# Patient Record
Sex: Female | Born: 1980 | Race: White | Hispanic: No | Marital: Married | Smoking: Never smoker
Health system: Southern US, Community
[De-identification: ages and names within clinical notes are randomized; demographics above are authoritative.]

## PROBLEM LIST (undated history)

## (undated) DIAGNOSIS — F329 Major depressive disorder, single episode, unspecified: Secondary | ICD-10-CM

## (undated) DIAGNOSIS — F32A Depression, unspecified: Secondary | ICD-10-CM

## (undated) DIAGNOSIS — F419 Anxiety disorder, unspecified: Secondary | ICD-10-CM

## (undated) DIAGNOSIS — F909 Attention-deficit hyperactivity disorder, unspecified type: Secondary | ICD-10-CM

## (undated) DIAGNOSIS — J45909 Unspecified asthma, uncomplicated: Secondary | ICD-10-CM

## (undated) HISTORY — PX: DILATION AND CURETTAGE OF UTERUS: SHX78

---
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
## 2005-09-30 ENCOUNTER — Encounter: Admission: RE | Admit: 2005-09-30 | Discharge: 2005-09-30 | Payer: Self-pay | Admitting: Family Medicine

## 2006-02-13 ENCOUNTER — Other Ambulatory Visit: Admission: RE | Admit: 2006-02-13 | Discharge: 2006-02-13 | Payer: Self-pay | Admitting: Obstetrics and Gynecology

## 2007-03-16 ENCOUNTER — Other Ambulatory Visit: Admission: RE | Admit: 2007-03-16 | Discharge: 2007-03-16 | Payer: Self-pay | Admitting: Obstetrics and Gynecology

## 2007-09-05 ENCOUNTER — Ambulatory Visit (HOSPITAL_COMMUNITY): Admission: RE | Admit: 2007-09-05 | Discharge: 2007-09-05 | Payer: Self-pay | Admitting: Family Medicine

## 2007-12-01 ENCOUNTER — Ambulatory Visit (HOSPITAL_COMMUNITY): Admission: RE | Admit: 2007-12-01 | Discharge: 2007-12-01 | Payer: Self-pay | Admitting: Obstetrics and Gynecology

## 2007-12-09 ENCOUNTER — Encounter: Admission: RE | Admit: 2007-12-09 | Discharge: 2007-12-09 | Payer: Self-pay | Admitting: Obstetrics and Gynecology

## 2008-08-07 ENCOUNTER — Other Ambulatory Visit: Admission: RE | Admit: 2008-08-07 | Discharge: 2008-08-07 | Payer: Self-pay | Admitting: Obstetrics and Gynecology

## 2008-08-21 ENCOUNTER — Ambulatory Visit (HOSPITAL_COMMUNITY): Admission: RE | Admit: 2008-08-21 | Discharge: 2008-08-21 | Payer: Self-pay | Admitting: Obstetrics and Gynecology

## 2008-08-21 ENCOUNTER — Encounter (INDEPENDENT_AMBULATORY_CARE_PROVIDER_SITE_OTHER): Payer: Self-pay | Admitting: Obstetrics and Gynecology

## 2008-08-21 ENCOUNTER — Other Ambulatory Visit: Payer: Self-pay | Admitting: Obstetrics and Gynecology

## 2009-10-06 ENCOUNTER — Ambulatory Visit (HOSPITAL_COMMUNITY): Admission: RE | Admit: 2009-10-06 | Discharge: 2009-10-06 | Payer: Self-pay | Admitting: Internal Medicine

## 2010-07-20 ENCOUNTER — Encounter (INDEPENDENT_AMBULATORY_CARE_PROVIDER_SITE_OTHER): Payer: Self-pay | Admitting: Obstetrics and Gynecology

## 2010-07-20 ENCOUNTER — Inpatient Hospital Stay (HOSPITAL_COMMUNITY): Admission: AD | Admit: 2010-07-20 | Discharge: 2010-07-22 | Payer: Self-pay | Admitting: Obstetrics and Gynecology

## 2010-12-08 ENCOUNTER — Encounter: Payer: Self-pay | Admitting: Obstetrics and Gynecology

## 2011-01-30 LAB — CBC
HCT: 34.9 % — ABNORMAL LOW (ref 36.0–46.0)
HCT: 35.3 % — ABNORMAL LOW (ref 36.0–46.0)
Hemoglobin: 11.8 g/dL — ABNORMAL LOW (ref 12.0–15.0)
Hemoglobin: 11.8 g/dL — ABNORMAL LOW (ref 12.0–15.0)
MCH: 31 pg (ref 26.0–34.0)
MCH: 31.1 pg (ref 26.0–34.0)
MCHC: 33.5 g/dL (ref 30.0–36.0)
MCHC: 33.8 g/dL (ref 30.0–36.0)
MCV: 91.6 fL (ref 78.0–100.0)
MCV: 92.8 fL (ref 78.0–100.0)
Platelets: 204 10*3/uL (ref 150–400)
Platelets: 214 10*3/uL (ref 150–400)
RBC: 3.81 MIL/uL — ABNORMAL LOW (ref 3.87–5.11)
RBC: 3.81 MIL/uL — ABNORMAL LOW (ref 3.87–5.11)
RDW: 14.7 % (ref 11.5–15.5)
RDW: 14.8 % (ref 11.5–15.5)
WBC: 11.3 10*3/uL — ABNORMAL HIGH (ref 4.0–10.5)
WBC: 20 10*3/uL — ABNORMAL HIGH (ref 4.0–10.5)

## 2011-01-30 LAB — RPR: RPR Ser Ql: NONREACTIVE

## 2011-02-19 LAB — CBC
HCT: 32.5 % — ABNORMAL LOW (ref 36.0–46.0)
Hemoglobin: 11.1 g/dL — ABNORMAL LOW (ref 12.0–15.0)
MCHC: 34.2 g/dL (ref 30.0–36.0)
MCV: 92.5 fL (ref 78.0–100.0)
Platelets: 212 10*3/uL (ref 150–400)
RBC: 3.52 MIL/uL — ABNORMAL LOW (ref 3.87–5.11)
RDW: 12.4 % (ref 11.5–15.5)
WBC: 6.5 10*3/uL (ref 4.0–10.5)

## 2011-02-19 LAB — LUPUS ANTICOAGULANT PANEL
DRVVT: 33 secs — ABNORMAL LOW (ref 34.7–40.5)
Lupus Anticoagulant: NOT DETECTED
PTT Lupus Anticoagulant: 35.5 secs (ref 32.0–43.4)

## 2011-02-19 LAB — ANA: Anti Nuclear Antibody(ANA): NEGATIVE

## 2011-04-01 NOTE — Op Note (Signed)
NAME:  Megan Foley, Megan Foley                ACCOUNT NO.:  [id number]   MEDICAL RECORD NO.:  [id number]          PATIENT TYPE:  AMB   LOCATION:  SDC                           FACILITY:  [facility]   PHYSICIAN:  Charles A. 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   PROCEDURE:  1. Dilation and evacuation.  2. Paracervical block.   SURGEON:  Charles A. Delcambre, MD   ASSISTANT:  None.   COMPLICATIONS:  None.   ESTIMATED BLOOD LOSS:  50 mL.   ANESTHESIA:  IV sedation, monitored anesthesia care.   FINDINGS:  Large amount of products of conception to pathology as well  as to chromosome analysis.   INSTRUMENT, SPONGE, AND NEEDLE COUNT:  Correct x2.   DESCRIPTION OF PROCEDURE:  The patient was taken to the operating room  and placed in supine position.  Anesthesia was dosed without difficulty.  She was then placed in dorsal lithotomy position.  Sterile prep and  drape was undertaken.  Tenaculum was used with a speculum in the vagina  and anterior lip of the cervix, and sound was gently passed to note 12  cm.  Hanks dilators were used to dilate up to a maximal Hanks dilator,  then three dilators were used up to Hegar #37 and this did allow passing  of a 10-mm curved suction curette, but I could not get a 12-mm curette  to go through the cervix safely.  For this reason, a 10-mm curette was  used, 50 mmHg of suction, and three successive passes yielded a large  amount of fluid and tissue.  Fourth pass yielded minimal  tissue.  Generalized curettage was used to achieve a gritty curetted surface.  Exploration with small polyp forceps and ring forceps yielded no  significant amounts of tissue.  A final pass with the suction curette  was undertaken and yielded no further tissue.  A large amount of tissue  in the first two passes could be seen coming out through the suction  tubing consistent with proper evacuation of the pregnancy.  Some tissue  was sent to chromosome analysis, and tenaculum was removed.  Hemostasis  was excellent.  Toradol 30 mg IV was given prophylactically, five 200  mcg tablets of  Cytotec were placed in the vagina, and she will maintain Methergine 0.2  every 6 hours as she has at home as well as Percocet 1-2 p.o. q.4-6 h.  p.r.n. #40.  She feels she has about 3 of these left at home.  The  patient was taken to recovery with physician attendance having tolerated  the procedure well.      Charles A. Delcambre,  MD  Electronically Signed     CAD/MEDQ  D:  08/21/2008  T:  08/22/2008  Job:  [id number]

## 2011-08-19 LAB — CBC
HCT: 34.9 — ABNORMAL LOW
Hemoglobin: 12
MCHC: 34.5
MCV: 91.9
Platelets: 256
RBC: 3.8 — ABNORMAL LOW
RDW: 11.8
WBC: 8.2

## 2011-08-19 LAB — ABO/RH: ABO/RH(D): A POS

## 2019-12-26 ENCOUNTER — Inpatient Hospital Stay (HOSPITAL_COMMUNITY): Payer: No Typology Code available for payment source

## 2019-12-26 ENCOUNTER — Inpatient Hospital Stay (HOSPITAL_COMMUNITY)
Admission: EM | Admit: 2019-12-26 | Discharge: 2019-12-30 | DRG: 517 | Disposition: A | Discharge status: Home Health | Payer: No Typology Code available for payment source | Attending: Student | Admitting: Student

## 2019-12-26 ENCOUNTER — Encounter (HOSPITAL_COMMUNITY): Payer: Self-pay | Admitting: Student

## 2019-12-26 ENCOUNTER — Encounter (HOSPITAL_COMMUNITY)
Admission: EM | Disposition: A | Discharge status: Home Health | Payer: Self-pay | Source: Home / Self Care | Attending: Student

## 2019-12-26 ENCOUNTER — Emergency Department (HOSPITAL_COMMUNITY): Payer: No Typology Code available for payment source

## 2019-12-26 ENCOUNTER — Inpatient Hospital Stay (HOSPITAL_COMMUNITY): Payer: No Typology Code available for payment source | Admitting: Certified Registered"

## 2019-12-26 DIAGNOSIS — S32422A Displaced fracture of posterior wall of left acetabulum, initial encounter for closed fracture: Secondary | ICD-10-CM | POA: Diagnosis not present

## 2019-12-26 DIAGNOSIS — Z20822 Contact with and (suspected) exposure to covid-19: Secondary | ICD-10-CM | POA: Diagnosis not present

## 2019-12-26 DIAGNOSIS — W010XXA Fall on same level from slipping, tripping and stumbling without subsequent striking against object, initial encounter: Secondary | ICD-10-CM | POA: Diagnosis present

## 2019-12-26 DIAGNOSIS — Z79899 Other long term (current) drug therapy: Secondary | ICD-10-CM

## 2019-12-26 DIAGNOSIS — W19XXXA Unspecified fall, initial encounter: Secondary | ICD-10-CM

## 2019-12-26 DIAGNOSIS — Y9289 Other specified places as the place of occurrence of the external cause: Secondary | ICD-10-CM

## 2019-12-26 DIAGNOSIS — S32402A Unspecified fracture of left acetabulum, initial encounter for closed fracture: Secondary | ICD-10-CM

## 2019-12-26 DIAGNOSIS — S73005A Unspecified dislocation of left hip, initial encounter: Secondary | ICD-10-CM

## 2019-12-26 DIAGNOSIS — Y990 Civilian activity done for income or pay: Secondary | ICD-10-CM | POA: Diagnosis not present

## 2019-12-26 DIAGNOSIS — J45909 Unspecified asthma, uncomplicated: Secondary | ICD-10-CM | POA: Diagnosis present

## 2019-12-26 DIAGNOSIS — T148XXA Other injury of unspecified body region, initial encounter: Secondary | ICD-10-CM

## 2019-12-26 DIAGNOSIS — F909 Attention-deficit hyperactivity disorder, unspecified type: Secondary | ICD-10-CM | POA: Diagnosis present

## 2019-12-26 DIAGNOSIS — Z888 Allergy status to other drugs, medicaments and biological substances status: Secondary | ICD-10-CM

## 2019-12-26 DIAGNOSIS — F329 Major depressive disorder, single episode, unspecified: Secondary | ICD-10-CM | POA: Diagnosis not present

## 2019-12-26 DIAGNOSIS — S73015A Posterior dislocation of left hip, initial encounter: Secondary | ICD-10-CM | POA: Insufficient documentation

## 2019-12-26 DIAGNOSIS — F419 Anxiety disorder, unspecified: Secondary | ICD-10-CM | POA: Diagnosis present

## 2019-12-26 DIAGNOSIS — Z419 Encounter for procedure for purposes other than remedying health state, unspecified: Secondary | ICD-10-CM

## 2019-12-26 DIAGNOSIS — M25552 Pain in left hip: Secondary | ICD-10-CM | POA: Diagnosis present

## 2019-12-26 HISTORY — PX: HIP CLOSED REDUCTION: SHX983

## 2019-12-26 HISTORY — DX: Unspecified asthma, uncomplicated: J45.909

## 2019-12-26 HISTORY — PX: INSERTION OF TRACTION PIN: SHX6560

## 2019-12-26 HISTORY — DX: Attention-deficit hyperactivity disorder, unspecified type: F90.9

## 2019-12-26 HISTORY — DX: Depression, unspecified: F32.A

## 2019-12-26 HISTORY — DX: Anxiety disorder, unspecified: F41.9

## 2019-12-26 LAB — CBC WITH DIFFERENTIAL/PLATELET
Abs Immature Granulocytes: 0.07 10*3/uL (ref 0.00–0.07)
Basophils Absolute: 0 10*3/uL (ref 0.0–0.1)
Basophils Relative: 0 %
Eosinophils Absolute: 0.1 10*3/uL (ref 0.0–0.5)
Eosinophils Relative: 1 %
HCT: 33.7 % — ABNORMAL LOW (ref 36.0–46.0)
Hemoglobin: 11.1 g/dL — ABNORMAL LOW (ref 12.0–15.0)
Immature Granulocytes: 1 %
Lymphocytes Relative: 14 %
Lymphs Abs: 1.8 10*3/uL (ref 0.7–4.0)
MCH: 30.7 pg (ref 26.0–34.0)
MCHC: 32.9 g/dL (ref 30.0–36.0)
MCV: 93.4 fL (ref 80.0–100.0)
Monocytes Absolute: 0.5 10*3/uL (ref 0.1–1.0)
Monocytes Relative: 4 %
Neutro Abs: 10.5 10*3/uL — ABNORMAL HIGH (ref 1.7–7.7)
Neutrophils Relative %: 80 %
Platelets: 255 10*3/uL (ref 150–400)
RBC: 3.61 MIL/uL — ABNORMAL LOW (ref 3.87–5.11)
RDW: 12 % (ref 11.5–15.5)
WBC: 12.8 10*3/uL — ABNORMAL HIGH (ref 4.0–10.5)
nRBC: 0 % (ref 0.0–0.2)

## 2019-12-26 LAB — I-STAT BETA HCG BLOOD, ED (MC, WL, AP ONLY): I-stat hCG, quantitative: <5 m[IU]/mL (ref <5)

## 2019-12-26 LAB — BASIC METABOLIC PANEL
Anion gap: 12 (ref 5–15)
BUN: 11 mg/dL (ref 6–20)
CO2: 23 mmol/L (ref 22–32)
Calcium: 9 mg/dL (ref 8.9–10.3)
Chloride: 103 mmol/L (ref 98–111)
Creatinine, Ser: 0.63 mg/dL (ref 0.44–1.00)
GFR calc Af Amer: >60 mL/min (ref >60)
GFR calc non Af Amer: >60 mL/min (ref >60)
Glucose, Bld: 106 mg/dL — ABNORMAL HIGH (ref 70–99)
Potassium: 3.4 mmol/L — ABNORMAL LOW (ref 3.5–5.1)
Sodium: 138 mmol/L (ref 135–145)

## 2019-12-26 LAB — RESPIRATORY PANEL BY RT PCR (FLU A&B, COVID)
Influenza A by PCR: NEGATIVE
Influenza B by PCR: NEGATIVE
SARS Coronavirus 2 by RT PCR: NEGATIVE

## 2019-12-26 LAB — HIV ANTIBODY (ROUTINE TESTING W REFLEX): HIV Screen 4th Generation wRfx: NONREACTIVE

## 2019-12-26 SURGERY — CLOSED REDUCTION, HIP
Anesthesia: General | Site: Leg Upper | Laterality: Left

## 2019-12-26 MED ORDER — MEPERIDINE HCL 25 MG/ML IJ SOLN: 6.2500 mg | INTRAMUSCULAR | Status: DC | PRN

## 2019-12-26 MED ORDER — MORPHINE SULFATE (PF) 2 MG/ML IV SOLN
2.0000 mg | INTRAVENOUS | Status: DC | PRN
  Administered 2019-12-28: 21:00:00 2 mg via INTRAVENOUS
  Filled 2019-12-26: qty 1

## 2019-12-26 MED ORDER — METHOCARBAMOL 500 MG PO TABS
500.0000 mg | ORAL_TABLET | Freq: Four times a day (QID) | ORAL | Status: DC | PRN
  Administered 2019-12-28 – 2019-12-30 (×4): 500 mg via ORAL
  Filled 2019-12-26 (×4): qty 1

## 2019-12-26 MED ORDER — CLONAZEPAM 0.5 MG PO TABS: 0.5000 mg | ORAL_TABLET | Freq: Two times a day (BID) | ORAL | Status: DC | PRN

## 2019-12-26 MED ORDER — LACTATED RINGERS IV SOLN: INTRAVENOUS | Status: DC | PRN

## 2019-12-26 MED ORDER — ALBUTEROL SULFATE (2.5 MG/3ML) 0.083% IN NEBU: 2.5000 mg | INHALATION_SOLUTION | Freq: Four times a day (QID) | RESPIRATORY_TRACT | Status: DC | PRN

## 2019-12-26 MED ORDER — ROCURONIUM BROMIDE 10 MG/ML (PF) SYRINGE
PREFILLED_SYRINGE | INTRAVENOUS | Status: AC
  Filled 2019-12-26: qty 20

## 2019-12-26 MED ORDER — SUGAMMADEX SODIUM 200 MG/2ML IV SOLN
INTRAVENOUS | Status: DC | PRN
  Administered 2019-12-26: 200 mg via INTRAVENOUS

## 2019-12-26 MED ORDER — TRAZODONE HCL 50 MG PO TABS
25.0000 mg | ORAL_TABLET | Freq: Every evening | ORAL | Status: DC | PRN
  Administered 2019-12-28: 21:00:00 25 mg via ORAL
  Filled 2019-12-26 (×2): qty 1

## 2019-12-26 MED ORDER — ENOXAPARIN SODIUM 40 MG/0.4ML ~~LOC~~ SOLN
40.0000 mg | SUBCUTANEOUS | Status: DC
  Administered 2019-12-27: 18:00:00 40 mg via SUBCUTANEOUS
  Filled 2019-12-26: qty 0.4

## 2019-12-26 MED ORDER — OXYCODONE HCL 5 MG PO TABS
5.0000 mg | ORAL_TABLET | ORAL | Status: DC | PRN
  Administered 2019-12-26 – 2019-12-27 (×2): 10 mg via ORAL
  Administered 2019-12-27: 22:00:00 15 mg via ORAL
  Administered 2019-12-27 (×2): 10 mg via ORAL
  Administered 2019-12-28 – 2019-12-29 (×7): 15 mg via ORAL
  Administered 2019-12-30 (×2): 10 mg via ORAL
  Administered 2019-12-30: 06:00:00 15 mg via ORAL
  Filled 2019-12-26 (×3): qty 3
  Filled 2019-12-26: qty 2
  Filled 2019-12-26 (×3): qty 3
  Filled 2019-12-26 (×2): qty 2
  Filled 2019-12-26 (×2): qty 3
  Filled 2019-12-26 (×3): qty 2
  Filled 2019-12-26: qty 3

## 2019-12-26 MED ORDER — ACETAMINOPHEN 10 MG/ML IV SOLN
INTRAVENOUS | Status: AC
  Filled 2019-12-26: qty 100

## 2019-12-26 MED ORDER — MIDAZOLAM HCL 2 MG/2ML IJ SOLN
INTRAMUSCULAR | Status: DC | PRN
  Administered 2019-12-26: 2 mg via INTRAVENOUS

## 2019-12-26 MED ORDER — ONDANSETRON HCL 4 MG/2ML IJ SOLN: 4.0000 mg | Freq: Four times a day (QID) | INTRAMUSCULAR | Status: DC | PRN

## 2019-12-26 MED ORDER — ACETAMINOPHEN 325 MG PO TABS: 325.0000 mg | ORAL_TABLET | Freq: Once | ORAL | Status: DC | PRN

## 2019-12-26 MED ORDER — PROPOFOL 10 MG/ML IV BOLUS
INTRAVENOUS | Status: AC
  Filled 2019-12-26: qty 20

## 2019-12-26 MED ORDER — SUCCINYLCHOLINE CHLORIDE 20 MG/ML IJ SOLN
INTRAMUSCULAR | Status: DC | PRN
  Administered 2019-12-26: 120 mg via INTRAVENOUS

## 2019-12-26 MED ORDER — LORATADINE 10 MG PO TABS
10.0000 mg | ORAL_TABLET | Freq: Every day | ORAL | Status: DC
  Administered 2019-12-26 – 2019-12-30 (×4): 10 mg via ORAL
  Filled 2019-12-26 (×4): qty 1

## 2019-12-26 MED ORDER — KETOROLAC TROMETHAMINE 30 MG/ML IJ SOLN
INTRAMUSCULAR | Status: AC
  Filled 2019-12-26: qty 1

## 2019-12-26 MED ORDER — DEXAMETHASONE SODIUM PHOSPHATE 10 MG/ML IJ SOLN
INTRAMUSCULAR | Status: DC | PRN
  Administered 2019-12-26: 5 mg via INTRAVENOUS

## 2019-12-26 MED ORDER — PROPOFOL 10 MG/ML IV BOLUS
INTRAVENOUS | Status: DC | PRN
  Administered 2019-12-26: 120 mg via INTRAVENOUS

## 2019-12-26 MED ORDER — FENTANYL CITRATE (PF) 100 MCG/2ML IJ SOLN
INTRAMUSCULAR | Status: DC | PRN
  Administered 2019-12-26: 50 ug via INTRAVENOUS
  Administered 2019-12-26: 100 ug via INTRAVENOUS

## 2019-12-26 MED ORDER — LACTATED RINGERS IV SOLN: INTRAVENOUS | Status: DC

## 2019-12-26 MED ORDER — ONDANSETRON HCL 4 MG/2ML IJ SOLN
INTRAMUSCULAR | Status: DC | PRN
  Administered 2019-12-26: 4 mg via INTRAVENOUS

## 2019-12-26 MED ORDER — HYDROMORPHONE HCL 1 MG/ML IJ SOLN
INTRAMUSCULAR | Status: AC
  Filled 2019-12-26: qty 1

## 2019-12-26 MED ORDER — METHOCARBAMOL 1000 MG/10ML IJ SOLN
500.0000 mg | Freq: Four times a day (QID) | INTRAVENOUS | Status: DC | PRN
  Filled 2019-12-26: qty 5

## 2019-12-26 MED ORDER — FENTANYL CITRATE (PF) 250 MCG/5ML IJ SOLN
INTRAMUSCULAR | Status: AC
  Filled 2019-12-26: qty 5

## 2019-12-26 MED ORDER — LIDOCAINE 2% (20 MG/ML) 5 ML SYRINGE
INTRAMUSCULAR | Status: AC
  Filled 2019-12-26: qty 5

## 2019-12-26 MED ORDER — LIDOCAINE 2% (20 MG/ML) 5 ML SYRINGE
INTRAMUSCULAR | Status: DC | PRN
  Administered 2019-12-26: 60 mg via INTRAVENOUS

## 2019-12-26 MED ORDER — MIDAZOLAM HCL 2 MG/2ML IJ SOLN
INTRAMUSCULAR | Status: AC
  Filled 2019-12-26: qty 2

## 2019-12-26 MED ORDER — ROCURONIUM BROMIDE 10 MG/ML (PF) SYRINGE
PREFILLED_SYRINGE | INTRAVENOUS | Status: DC | PRN
  Administered 2019-12-26: 40 mg via INTRAVENOUS

## 2019-12-26 MED ORDER — KETOROLAC TROMETHAMINE 30 MG/ML IJ SOLN
30.0000 mg | Freq: Four times a day (QID) | INTRAMUSCULAR | Status: DC | PRN
  Administered 2019-12-26: 18:00:00 30 mg via INTRAVENOUS

## 2019-12-26 MED ORDER — HYDROMORPHONE HCL 1 MG/ML IJ SOLN
0.2500 mg | INTRAMUSCULAR | Status: DC | PRN
  Administered 2019-12-26 (×2): 0.5 mg via INTRAVENOUS

## 2019-12-26 MED ORDER — ESCITALOPRAM OXALATE 10 MG PO TABS
10.0000 mg | ORAL_TABLET | Freq: Every day | ORAL | Status: DC
  Administered 2019-12-27 – 2019-12-30 (×3): 10 mg via ORAL
  Filled 2019-12-26 (×3): qty 1

## 2019-12-26 MED ORDER — PROMETHAZINE HCL 25 MG/ML IJ SOLN: 6.2500 mg | INTRAMUSCULAR | Status: DC | PRN

## 2019-12-26 MED ORDER — HYDROMORPHONE HCL 1 MG/ML IJ SOLN
1.0000 mg | Freq: Once | INTRAMUSCULAR | Status: AC
  Administered 2019-12-26: 14:00:00 1 mg via INTRAVENOUS
  Filled 2019-12-26: qty 1

## 2019-12-26 MED ORDER — ACETAMINOPHEN 10 MG/ML IV SOLN
1000.0000 mg | Freq: Once | INTRAVENOUS | Status: DC | PRN
  Administered 2019-12-26: 18:00:00 1000 mg via INTRAVENOUS

## 2019-12-26 MED ORDER — MORPHINE SULFATE (PF) 4 MG/ML IV SOLN
4.0000 mg | Freq: Once | INTRAVENOUS | Status: AC
  Administered 2019-12-26: 13:00:00 4 mg via INTRAVENOUS
  Filled 2019-12-26: qty 1

## 2019-12-26 MED ORDER — ONDANSETRON HCL 4 MG PO TABS: 4.0000 mg | ORAL_TABLET | Freq: Four times a day (QID) | ORAL | Status: DC | PRN

## 2019-12-26 MED ORDER — KCL IN DEXTROSE-NACL 20-5-0.45 MEQ/L-%-% IV SOLN
INTRAVENOUS | Status: DC
  Filled 2019-12-26 (×4): qty 1000

## 2019-12-26 MED ORDER — SUCCINYLCHOLINE CHLORIDE 200 MG/10ML IV SOSY
PREFILLED_SYRINGE | INTRAVENOUS | Status: AC
  Filled 2019-12-26: qty 10

## 2019-12-26 MED ORDER — ACETAMINOPHEN 160 MG/5ML PO SOLN: 325.0000 mg | Freq: Once | ORAL | Status: DC | PRN

## 2019-12-26 SURGICAL SUPPLY — 41 items
APL PRP STRL LF DISP 70% ISPRP (MISCELLANEOUS) ×2
BNDG ELASTIC 4X5.8 VLCR STR LF (GAUZE/BANDAGES/DRESSINGS) ×2 IMPLANT
BNDG ELASTIC 6X5.8 VLCR STR LF (GAUZE/BANDAGES/DRESSINGS) ×2 IMPLANT
BNDG GAUZE ELAST 4 BULKY (GAUZE/BANDAGES/DRESSINGS) ×6 IMPLANT
BRUSH SCRUB EZ PLAIN DRY (MISCELLANEOUS) ×6 IMPLANT
CHLORAPREP W/TINT 26 (MISCELLANEOUS) ×4 IMPLANT
COVER SURGICAL LIGHT HANDLE (MISCELLANEOUS) ×4 IMPLANT
COVER WAND RF STERILE (DRAPES) ×2 IMPLANT
DRAPE C-ARM 42X72 X-RAY (DRAPES) IMPLANT
DRAPE C-ARMOR (DRAPES) IMPLANT
DRAPE U-SHAPE 47X51 STRL (DRAPES) ×4 IMPLANT
DRSG ADAPTIC 3X8 NADH LF (GAUZE/BANDAGES/DRESSINGS) ×2 IMPLANT
ELECT REM PT RETURN 9FT ADLT (ELECTROSURGICAL)
ELECTRODE REM PT RTRN 9FT ADLT (ELECTROSURGICAL) ×2 IMPLANT
GAUZE SPONGE 4X4 12PLY STRL (GAUZE/BANDAGES/DRESSINGS) ×2 IMPLANT
GLOVE BIO SURGEON STRL SZ 6.5 (GLOVE) ×7 IMPLANT
GLOVE BIO SURGEON STRL SZ7.5 (GLOVE) ×10 IMPLANT
GLOVE BIO SURGEONS STRL SZ 6.5 (GLOVE) ×1
GLOVE BIOGEL PI IND STRL 6.5 (GLOVE) ×2 IMPLANT
GLOVE BIOGEL PI IND STRL 7.5 (GLOVE) ×2 IMPLANT
GLOVE BIOGEL PI INDICATOR 6.5 (GLOVE)
GLOVE BIOGEL PI INDICATOR 7.5 (GLOVE)
GOWN STRL REUS W/ TWL LRG LVL3 (GOWN DISPOSABLE) ×4 IMPLANT
GOWN STRL REUS W/TWL LRG LVL3 (GOWN DISPOSABLE) ×4
KIT BASIN OR (CUSTOM PROCEDURE TRAY) ×2 IMPLANT
KIT TURNOVER KIT B (KITS) ×4 IMPLANT
MANIFOLD NEPTUNE II (INSTRUMENTS) ×2 IMPLANT
NS IRRIG 1000ML POUR BTL (IV SOLUTION) ×2 IMPLANT
PACK SURGICAL SETUP 50X90 (CUSTOM PROCEDURE TRAY) ×2 IMPLANT
PACK TOTAL JOINT (CUSTOM PROCEDURE TRAY) ×2 IMPLANT
PAD ARMBOARD 7.5X6 YLW CONV (MISCELLANEOUS) ×4 IMPLANT
PADDING CAST COTTON 6X4 STRL (CAST SUPPLIES) ×6 IMPLANT
PIN STEINMAN 5/64X9 TRO PT NS (PIN) ×2 IMPLANT
SPONGE LAP 18X18 RF (DISPOSABLE) ×2 IMPLANT
STAPLER VISISTAT 35W (STAPLE) IMPLANT
SUT MNCRL AB 3-0 PS2 18 (SUTURE) ×2 IMPLANT
SUT MON AB 2-0 CT1 36 (SUTURE) ×2 IMPLANT
TOWEL GREEN STERILE (TOWEL DISPOSABLE) ×6 IMPLANT
TOWEL GREEN STERILE FF (TOWEL DISPOSABLE) ×6 IMPLANT
UNDERPAD 30X30 (UNDERPADS AND DIAPERS) ×2 IMPLANT
WATER STERILE IRR 1000ML POUR (IV SOLUTION) ×4 IMPLANT

## 2019-12-26 NOTE — Progress Notes (Signed)
Orthopedic Tech Progress Note Patient Details:  [name] [birth year] [id number] CT tech called for skeletal traction to be removed and reapplied for scans.  Musculoskeletal Traction Type of Traction: Skeletal (Balanced Suspension) Traction Location: LLE Traction Weight: 20 lbs   Post Interventions Patient Tolerated: Well  Patient ID: Megan Foley, female   DOB: 22-May-1981, 39 y.o.   MRN: [id number]   Ancil Linsey 12/26/2019, 10:44 PM

## 2019-12-26 NOTE — Op Note (Signed)
Orthopaedic Surgery Operative Note (CSN: [id number] ) Date of Surgery: 12/26/2019  Admit Date: 12/26/2019   Diagnoses: Pre-Op Diagnoses: Left posterior wall acetabular fracture-dislocation  Post-Op Diagnosis: Same  Procedures: 1. CPT 27252-Closed reduction of left hip dislocation 2. 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  She was brought to the emergency room where x-rays showed a left acetabular fracture dislocation.  I recommended proceeding urgently to the operating room for closed reduction and traction pin placement.  Risks and benefits were discussed with the patient.  She agreed to proceed with surgery and consent was obtained.  Operative Findings: 1.  Large posterior wall acetabular fracture dislocation with a successful closed reduction 2.  Distal femoral traction pin placement with 20 pounds of traction placed to the lower extremity.  Procedure: The patient was identified in the preoperative holding area. Consent was confirmed with the patient and their family and all questions were answered. The operative extremity was marked after confirmation with the patient. she was then brought back to the operating room by our anesthesia colleagues.  She was placed under general anesthetic and carefully transferred over to a radiolucent flat top table.  A timeout was performed to verify the patient, the procedure, and the extremity.  No antibiotics were given due to the close procedure.  Fluoroscopic imaging was obtained which showed a posterior  acetabular fracture dislocation with a large posterior wall fragment.  A gentle reduction maneuver was performed with an audible clunk.  Post reduction x-rays showed a concentric reduction of the femoral head.  The distal portion of her thigh was prepped and draped in sterile fashion.  Percutaneous placement of a 2.0 mm K wire was placed and advanced from medial to lateral through the lateral portion of the thigh.  A traction bow was placed to this traction pin and 20 pounds of traction was hung off the edge of the bed.  The patient was then awoken from anesthesia and taken the PACU in stable condition.  Post Op Plan/Instructions: Patient will be on bed rest until surgical fixation of her left hip.  We will obtain a postreduction CT scan for surgical planning.  We will likely plan for surgery tentatively on Wednesday morning.  She may be started on Lovenox for DVT prophylaxis starting tomorrow.  I was present and performed the entire surgery.  Ulyses Southward, PA-C did assist me throughout the case. An assistant was necessary given the difficulty in approach, maintenance of reduction and ability to instrument the fracture.   Truitt Merle, MD [facility]

## 2019-12-26 NOTE — Anesthesia Preprocedure Evaluation (Signed)
Anesthesia Evaluation  Patient identified by MRN, date of birth, ID band Patient awake    Reviewed: Allergy & Precautions, NPO status , Patient's Chart, lab work & pertinent test results  Airway Mallampati: I  TM Distance: >3 FB Neck ROM: Full    Dental no notable dental hx. (+) Dental Advisory Given   Pulmonary asthma ,    Pulmonary exam normal        Cardiovascular negative cardio ROS Normal cardiovascular exam     Neuro/Psych Anxiety    GI/Hepatic negative GI ROS, Neg liver ROS,   Endo/Other  negative endocrine ROS  Renal/GU negative Renal ROS     Musculoskeletal   Abdominal Normal abdominal exam  (+)   Peds  Hematology negative hematology ROS (+)   Anesthesia Other Findings   Reproductive/Obstetrics                             Anesthesia Physical Anesthesia Plan  ASA: II and emergent  Anesthesia Plan: General   Post-op Pain Management:    Induction: Intravenous, Cricoid pressure planned and Rapid sequence  PONV Risk Score and Plan: 4 or greater and Ondansetron, Dexamethasone, Midazolam and Scopolamine patch - Pre-op  Airway Management Planned: Oral ETT  Additional Equipment: None  Intra-op Plan:   Post-operative Plan: Extubation in OR  Informed Consent: I have reviewed the patients History and Physical, chart, labs and discussed the procedure including the risks, benefits and alternatives for the proposed anesthesia with the patient or authorized representative who has indicated his/her understanding and acceptance.     Dental advisory given  Plan Discussed with: CRNA  Anesthesia Plan Comments:         Anesthesia Quick Evaluation

## 2019-12-26 NOTE — H&P (Signed)
Please see consult note from same day for full H&P.

## 2019-12-26 NOTE — ED Notes (Signed)
Report given to short stay RN, consent on bed, pt states no cash valuable, cell phone on bed, will go to PACU with pt

## 2019-12-26 NOTE — ED Notes (Signed)
Consent signed.

## 2019-12-26 NOTE — H&P (View-Only) (Signed)
Reason for Consult:Acet fx Referring Physician: P Messick  Megan Foley is an 39 y.o. female.  HPI: Megan Foley was walking and tripped over a piece of playground equipment. She fell and landed on her left knee/side. She heard a pop and had immediate left hip pain. She managed to get up but had severe pain and fell down again. She works as a Runner, broadcasting/film/video.  No past medical history on file.  No family history on file.  Social History:  has no history on file for tobacco, alcohol, and drug.  Allergies:  Allergies  Allergen Reactions  . Mushroom Extract Complex Anaphylaxis, Anxiety, Swelling, Other (See Comments), Itching, Nausea And Vomiting, Shortness Of Breath and Hives    Medications: I have reviewed the patient's current medications.  Results for orders placed or performed during the hospital encounter of 12/26/19 (from the past 48 hour(s))  CBC with Differential     Status: Abnormal   Collection Time: 12/26/19  1:15 PM  Result Value Ref Range   WBC 12.8 (H) 4.0 - 10.5 K/uL   RBC 3.61 (L) 3.87 - 5.11 MIL/uL   Hemoglobin 11.1 (L) 12.0 - 15.0 g/dL   HCT 16.1 (L) 09.6 - 04.5 %   MCV 93.4 80.0 - 100.0 fL   MCH 30.7 26.0 - 34.0 pg   MCHC 32.9 30.0 - 36.0 g/dL   RDW 40.9 81.1 - 91.4 %   Platelets 255 150 - 400 K/uL   nRBC 0.0 0.0 - 0.2 %   Neutrophils Relative % 80 %   Neutro Abs 10.5 (H) 1.7 - 7.7 K/uL   Lymphocytes Relative 14 %   Lymphs Abs 1.8 0.7 - 4.0 K/uL   Monocytes Relative 4 %   Monocytes Absolute 0.5 0.1 - 1.0 K/uL   Eosinophils Relative 1 %   Eosinophils Absolute 0.1 0.0 - 0.5 K/uL   Basophils Relative 0 %   Basophils Absolute 0.0 0.0 - 0.1 K/uL   Immature Granulocytes 1 %   Abs Immature Granulocytes 0.07 0.00 - 0.07 K/uL    Comment: Performed at [facility], [address], [place], [place] [address]  Basic metabolic panel     Status: Abnormal   Collection Time: 12/26/19  1:15 PM  Result Value Ref Range   Sodium 138 135 - 145 mmol/L   Potassium 3.4 (L) 3.5 - 5.1  mmol/L   Chloride 103 98 - 111 mmol/L   CO2 23 22 - 32 mmol/L   Glucose, Bld 106 (H) 70 - 99 mg/dL   BUN 11 6 - 20 mg/dL   Creatinine, Ser 6.21 0.44 - 1.00 mg/dL   Calcium 9.0 8.9 - 30.8 mg/dL   GFR calc non Af Amer >60 >60 mL/min   GFR calc Af Amer >60 >60 mL/min   Anion gap 12 5 - 15    Comment: Performed at [facility], [address] [place], [place] [address]  I-Stat beta hCG blood, ED     Status: None   Collection Time: 12/26/19  1:18 PM  Result Value Ref Range   I-stat hCG, quantitative <5.0 <5 mIU/mL   Comment 3            Comment:   GEST. AGE      CONC.  (mIU/mL)   <=1 WEEK        5 - 50     2 WEEKS       50 - 500     3 WEEKS  100 - 10,000     4 WEEKS     1,000 - 30,000        FEMALE AND NON-PREGNANT FEMALE:     LESS THAN 5 mIU/mL     DG Pelvis 1-2 Views  Result Date: 12/26/2019 CLINICAL DATA:  Hip pain secondary to a fall today. EXAM: PELVIS - 1-2 VIEW COMPARISON:  None. FINDINGS: There is a fracture dislocation at the left hip. The posterior rim of the acetabulum is comminuted. The femoral head is posteriorly and superiorly displaced. No discrete proximal femur fracture. The other pelvic bones appear normal. IMPRESSION: Fracture dislocation of the left hip. Electronically Signed   By: James  Maxwell M.D.   On: 12/26/2019 14:23   DG Femur Min 2 Views Left  Result Date: 12/26/2019 CLINICAL DATA:  Left hip pain secondary to a fall. EXAM: LEFT FEMUR 2 VIEWS COMPARISON:  None. FINDINGS: There is posterior dislocation of the left femoral head with a comminuted fracture of the posterior rim of the acetabulum. The femur is intact. IMPRESSION: Posterior dislocation of the left femoral head with a comminuted fracture of the posterior rim of the acetabulum. Electronically Signed   By: James  Maxwell M.D.   On: 12/26/2019 14:22    Review of Systems  HENT: Negative for ear discharge, ear pain, hearing loss and tinnitus.   Eyes: Negative for photophobia and pain.   Respiratory: Negative for cough and shortness of breath.   Cardiovascular: Negative for chest pain.  Gastrointestinal: Negative for abdominal pain, nausea and vomiting.  Genitourinary: Negative for dysuria, flank pain, frequency and urgency.  Musculoskeletal: Positive for arthralgias (Left hip). Negative for back pain, myalgias and neck pain.  Neurological: Negative for dizziness and headaches.  Hematological: Does not bruise/bleed easily.  Psychiatric/Behavioral: The patient is not nervous/anxious.    Blood pressure 122/82, pulse 90, temperature (!) 97.4 F (36.3 C), temperature source Oral, resp. rate 20, SpO2 99 %. Physical Exam  Constitutional: She appears well-developed and well-nourished. No distress.  HENT:  Head: Normocephalic and atraumatic.  Eyes: Conjunctivae are normal. Right eye exhibits no discharge. Left eye exhibits no discharge. No scleral icterus.  Cardiovascular: Normal rate and regular rhythm.  Respiratory: Effort normal. No respiratory distress.  Musculoskeletal:     Cervical back: Normal range of motion.     Comments: LLE No traumatic wounds, ecchymosis, or rash  Severe TTP hip  No knee or ankle effusion  Knee stable to varus/ valgus and anterior/posterior stress  Sens DPN, SPN, TN intact  Motor EHL, ext, flex, evers 5/5  DP 1+, PT 1+, No significant edema  Neurological: She is alert.  Skin: Skin is warm and dry. She is not diaphoretic.  Psychiatric: She has a normal mood and affect. Her behavior is normal.    Assessment/Plan: Left acet fx/dislocation -- Plan on CR and traction pin placement by Dr. Haddix. Definitive fixation planned tomorrow or Wednesday. Asthma    Adrie Picking J. Lamount Bankson, PA-C Orthopedic Surgery [phone] 12/26/2019, 2:48 PM  

## 2019-12-26 NOTE — Transfer of Care (Signed)
Immediate Anesthesia Transfer of Care Note  Patient: Quin Hoop  Procedure(s) Performed: CLOSED REDUCTION HIP (Left Hip) INSERTION OF TRACTION PIN (Left Leg Upper)  Patient Location: PACU  Anesthesia Type:General  Level of Consciousness: awake and oriented  Airway & Oxygen Therapy: Patient Spontanous Breathing  Post-op Assessment: Report given to RN  Post vital signs: Reviewed and stable  Last Vitals:  Vitals Value Taken Time  BP 108/65 12/26/19 1723  Temp    Pulse 73 12/26/19 1723  Resp 14 12/26/19 1723  SpO2 100 % 12/26/19 1723  Vitals shown include unvalidated device data.  Last Pain:  Vitals:   12/26/19 1430  TempSrc:   PainSc: 7          Complications: No apparent anesthesia complications

## 2019-12-26 NOTE — ED Provider Notes (Signed)
[facility] EMERGENCY DEPARTMENT Provider Note   CSN: [id number] Arrival date & time: 12/26/19  1243     History Chief Complaint  Patient presents with  . Fall    Left hip pain    Megan Foley is a 39 y.o. female.  39 year old female with prior medical history as detailed below presents for evaluation of injury to the left hip.  Patient reports that she was at work and tripped in the mud.  She landed hard on her left knee with immediate pain to the left hip.  She was not able to ambulate after the fall.  She denies other injury.  She denies prior injury to the left hip itself.  She denies other significant medical history.  The history is provided by the patient and medical records.  Hip Pain This is a new problem. The current episode started less than 1 hour ago. The problem occurs constantly. The problem has not changed since onset.Nothing aggravates the symptoms. The symptoms are relieved by narcotics.       No past medical history on file.  Patient Active Problem List   Diagnosis Date Noted  . Left acetabular fracture (HCC) 12/26/2019     OB History   No obstetric history on file.     No family history on file.  Social History   Tobacco Use  . Smoking status: Not on file  Substance Use Topics  . Alcohol use: Not on file  . Drug use: Not on file    Home Medications Prior to Admission medications   Medication Sig Start Date End Date Taking? Authorizing Provider  acetaminophen (TYLENOL) 500 MG tablet Take 1,000 mg by mouth every 6 (six) hours as needed for mild pain or headache.   Yes [provider]  albuterol (VENTOLIN HFA) 108 (90 Base) MCG/ACT inhaler Inhale 2 puffs into the lungs every 6 (six) hours as needed for wheezing.  11/24/19  Yes [provider]  cetirizine (ZYRTEC) 10 MG chewable tablet Chew 10 mg by mouth daily.   Yes [provider]  clonazePAM (KLONOPIN) 0.5 MG tablet Take 0.5 mg by mouth 2 (two)  times daily as needed for anxiety. 09/12/19  Yes [provider]  EPINEPHrine 0.3 mg/0.3 mL IJ SOAJ injection Inject 3 mLs into the muscle once. As needed for Anaphylaxis 09/12/19  Yes [provider]  escitalopram (LEXAPRO) 10 MG tablet Take 10 mg by mouth daily. 10/06/19  Yes [provider]  Multiple Vitamins-Minerals (HAIR SKIN & NAILS ADVANCED PO) Take 3 tablets by mouth daily.   Yes [provider]  Multiple Vitamins-Minerals (MULTI ADULT GUMMIES PO) Take 1 tablet by mouth daily.   Yes [provider]  traZODone (DESYREL) 50 MG tablet Take 25 mg by mouth at bedtime as needed for sleep. 09/12/19  Yes [provider]  VYVANSE 30 MG capsule Take 30 mg by mouth every morning. 12/21/19  Yes [provider]    Allergies    Mushroom extract complex  Review of Systems   Review of Systems  All other systems reviewed and are negative.   Physical Exam Updated Vital Signs BP 122/82 (BP Location: Right Arm)   Pulse 90   Temp (!) 97.4 F (36.3 C) (Oral)   Resp 20   SpO2 99%   Physical Exam Vitals and nursing note reviewed.  Constitutional:      General: She is not in acute distress.    Appearance: Normal appearance. She is well-developed.  HENT:     Head: Normocephalic and atraumatic.  Eyes:     Conjunctiva/sclera: Conjunctivae normal.     Pupils: Pupils are equal, round, and reactive to light.  Cardiovascular:     Rate and Rhythm: Normal rate and regular rhythm.     Heart sounds: Normal heart sounds.  Pulmonary:     Effort: Pulmonary effort is normal. No respiratory distress.     Breath sounds: Normal breath sounds.  Abdominal:     General: There is no distension.     Palpations: Abdomen is soft.     Tenderness: There is no abdominal tenderness.  Musculoskeletal:        General: Tenderness present. No deformity. Normal range of motion.     Cervical back: Normal range of motion and neck supple.     Comments:  Significant tenderness to the left lateral hip.  Patient's left leg is held in a slightly flexed and internally rotated position for comfort.  Distal left lower extremity is neurovascular intact.  Skin:    General: Skin is warm and dry.  Neurological:     Mental Status: She is alert and oriented to person, place, and time.     ED Results / Procedures / Treatments   Labs (all labs ordered are listed, but only abnormal results are displayed) Labs Reviewed  CBC WITH DIFFERENTIAL/PLATELET - Abnormal; Notable for the following components:      Result Value   WBC 12.8 (*)    RBC 3.61 (*)    Hemoglobin 11.1 (*)    HCT 33.7 (*)    Neutro Abs 10.5 (*)    All other components within normal limits  BASIC METABOLIC PANEL - Abnormal; Notable for the following components:   Potassium 3.4 (*)    Glucose, Bld 106 (*)    All other components within normal limits  RESPIRATORY PANEL BY RT PCR (FLU A&B, COVID)  HIV ANTIBODY (ROUTINE TESTING W REFLEX)  I-STAT BETA HCG BLOOD, ED ([facility], [facility], [facility] ONLY)    EKG None  Radiology DG Pelvis 1-2 Views  Result Date: 12/26/2019 CLINICAL DATA:  Hip pain secondary to a fall today. EXAM: PELVIS - 1-2 VIEW COMPARISON:  None. FINDINGS: There is a fracture dislocation at the left hip. The posterior rim of the acetabulum is comminuted. The femoral head is posteriorly and superiorly displaced. No discrete proximal femur fracture. The other pelvic bones appear normal. IMPRESSION: Fracture dislocation of the left hip. Electronically Signed   By: Lorriane Shire M.D.   On: 12/26/2019 14:23   DG Femur Min 2 Views Left  Result Date: 12/26/2019 CLINICAL DATA:  Left hip pain secondary to a fall. EXAM: LEFT FEMUR 2 VIEWS COMPARISON:  None. FINDINGS: There is posterior dislocation of the left femoral head with a comminuted fracture of the posterior rim of the acetabulum. The femur is intact. IMPRESSION: Posterior dislocation of the left femoral head with a comminuted fracture of the  posterior rim of the acetabulum. Electronically Signed   By: Lorriane Shire M.D.   On: 12/26/2019 14:22    Procedures Procedures (including critical care time)  Medications Ordered in ED Medications  enoxaparin (LOVENOX) injection 40 mg (has no administration in time range)  dextrose 5 % and 0.45 % NaCl with KCl 20 mEq/L infusion (has no administration in time range)  methocarbamol (ROBAXIN) tablet 500 mg (has no administration in time range)    Or  methocarbamol (ROBAXIN) 500 mg in dextrose 5 % 50 mL IVPB (has no  administration in time range)  ondansetron (ZOFRAN) tablet 4 mg (has no administration in time range)    Or  ondansetron (ZOFRAN) injection 4 mg (has no administration in time range)  oxyCODONE (Oxy IR/ROXICODONE) immediate release tablet 5-15 mg (has no administration in time range)  morphine 2 MG/ML injection 2 mg (has no administration in time range)  morphine 4 MG/ML injection 4 mg (4 mg Intravenous Given 12/26/19 1313)  HYDROmorphone (DILAUDID) injection 1 mg (1 mg Intravenous Given 12/26/19 1415)    ED Course  I have reviewed the triage vital signs and the nursing notes.  Pertinent labs & imaging results that were available during my care of the patient were reviewed by me and considered in my medical decision making (see chart for details).    MDM Rules/Calculators/A&P                      MDM  Screen complete  MAYRIN SCHMUCK was evaluated in Emergency Department on 12/26/2019 for the symptoms described in the history of present illness. She was evaluated in the context of the global COVID-19 pandemic, which necessitated consideration that the patient might be at risk for infection with the SARS-CoV-2 virus that causes COVID-19. Institutional protocols and algorithms that pertain to the evaluation of patients at risk for COVID-19 are in a state of rapid change based on information released by regulatory bodies including the CDC and federal and state organizations. These  policies and algorithms were followed during the patient's care in the ED.  Patient is presenting for evaluation of left hip pain following a fall.  Work-up reveals evidence of left posterior hip dislocation with posterior acetabular fracture.  Orthopedics is aware case and will evaluate for admission.   Final Clinical Impression(s) / ED Diagnoses Final diagnoses:  Closed dislocation of left hip, initial encounter [facility])    Rx / DC Orders ED Discharge Orders    None       Wynetta Fines, MD 12/26/19 [phone]

## 2019-12-26 NOTE — Anesthesia Procedure Notes (Signed)
Procedure Name: Intubation Date/Time: 12/26/2019 4:53 PM Performed by: Barrington Ellison, CRNA Pre-anesthesia Checklist: Patient identified, Emergency Drugs available, Suction available and Patient being monitored Patient Re-evaluated:Patient Re-evaluated prior to induction Oxygen Delivery Method: Circle System Utilized Preoxygenation: Pre-oxygenation with 100% oxygen Induction Type: IV induction, Rapid sequence and Cricoid Pressure applied Ventilation: Mask ventilation without difficulty Laryngoscope Size: Mac and 3 Grade View: Grade I Tube type: Oral Tube size: 7.0 mm Number of attempts: 1 Airway Equipment and Method: Stylet and Oral airway Placement Confirmation: ETT inserted through vocal cords under direct vision,  positive ETCO2 and breath sounds checked- equal and bilateral Secured at: 21 cm Tube secured with: Tape Dental Injury: Teeth and Oropharynx as per pre-operative assessment

## 2019-12-26 NOTE — ED Triage Notes (Addendum)
Patient verbalizes understanding of discharge instructions. Opportunity for questioning and answers were provided. Armband removed by staff, pt discharged from ED  PT received 150 mcg of Fentanyl in route

## 2019-12-26 NOTE — ED Notes (Signed)
Ice packs placed.

## 2019-12-26 NOTE — Consult Note (Signed)
Reason for Consult:Acet fx Referring Physician: P Messick  Megan Foley is an 39 y.o. female.  HPI: Kayana was walking and tripped over a piece of playground equipment. She fell and landed on her left knee/side. She heard a pop and had immediate left hip pain. She managed to get up but had severe pain and fell down again. She works as a Runner, broadcasting/film/video.  No past medical history on file.  No family history on file.  Social History:  has no history on file for tobacco, alcohol, and drug.  Allergies:  Allergies  Allergen Reactions  . Mushroom Extract Complex Anaphylaxis, Anxiety, Swelling, Other (See Comments), Itching, Nausea And Vomiting, Shortness Of Breath and Hives    Medications: I have reviewed the patient's current medications.  Results for orders placed or performed during the hospital encounter of 12/26/19 (from the past 48 hour(s))  CBC with Differential     Status: Abnormal   Collection Time: 12/26/19  1:15 PM  Result Value Ref Range   WBC 12.8 (H) 4.0 - 10.5 K/uL   RBC 3.61 (L) 3.87 - 5.11 MIL/uL   Hemoglobin 11.1 (L) 12.0 - 15.0 g/dL   HCT 16.1 (L) 09.6 - 04.5 %   MCV 93.4 80.0 - 100.0 fL   MCH 30.7 26.0 - 34.0 pg   MCHC 32.9 30.0 - 36.0 g/dL   RDW 40.9 81.1 - 91.4 %   Platelets 255 150 - 400 K/uL   nRBC 0.0 0.0 - 0.2 %   Neutrophils Relative % 80 %   Neutro Abs 10.5 (H) 1.7 - 7.7 K/uL   Lymphocytes Relative 14 %   Lymphs Abs 1.8 0.7 - 4.0 K/uL   Monocytes Relative 4 %   Monocytes Absolute 0.5 0.1 - 1.0 K/uL   Eosinophils Relative 1 %   Eosinophils Absolute 0.1 0.0 - 0.5 K/uL   Basophils Relative 0 %   Basophils Absolute 0.0 0.0 - 0.1 K/uL   Immature Granulocytes 1 %   Abs Immature Granulocytes 0.07 0.00 - 0.07 K/uL    Comment: Performed at [facility], [address], [place], [place] [address]  Basic metabolic panel     Status: Abnormal   Collection Time: 12/26/19  1:15 PM  Result Value Ref Range   Sodium 138 135 - 145 mmol/L   Potassium 3.4 (L) 3.5 - 5.1  mmol/L   Chloride 103 98 - 111 mmol/L   CO2 23 22 - 32 mmol/L   Glucose, Bld 106 (H) 70 - 99 mg/dL   BUN 11 6 - 20 mg/dL   Creatinine, Ser 6.21 0.44 - 1.00 mg/dL   Calcium 9.0 8.9 - 30.8 mg/dL   GFR calc non Af Amer >60 >60 mL/min   GFR calc Af Amer >60 >60 mL/min   Anion gap 12 5 - 15    Comment: Performed at [facility], [address] [place], [place] [address]  I-Stat beta hCG blood, ED     Status: None   Collection Time: 12/26/19  1:18 PM  Result Value Ref Range   I-stat hCG, quantitative <5.0 <5 mIU/mL   Comment 3            Comment:   GEST. AGE      CONC.  (mIU/mL)   <=1 WEEK        5 - 50     2 WEEKS       50 - 500     3 WEEKS  100 - 10,000     4 WEEKS     1,000 - 30,000        FEMALE AND NON-PREGNANT FEMALE:     LESS THAN 5 mIU/mL     DG Pelvis 1-2 Views  Result Date: 12/26/2019 CLINICAL DATA:  Hip pain secondary to a fall today. EXAM: PELVIS - 1-2 VIEW COMPARISON:  None. FINDINGS: There is a fracture dislocation at the left hip. The posterior rim of the acetabulum is comminuted. The femoral head is posteriorly and superiorly displaced. No discrete proximal femur fracture. The other pelvic bones appear normal. IMPRESSION: Fracture dislocation of the left hip. Electronically Signed   By: Lorriane Shire M.D.   On: 12/26/2019 14:23   DG Femur Min 2 Views Left  Result Date: 12/26/2019 CLINICAL DATA:  Left hip pain secondary to a fall. EXAM: LEFT FEMUR 2 VIEWS COMPARISON:  None. FINDINGS: There is posterior dislocation of the left femoral head with a comminuted fracture of the posterior rim of the acetabulum. The femur is intact. IMPRESSION: Posterior dislocation of the left femoral head with a comminuted fracture of the posterior rim of the acetabulum. Electronically Signed   By: Lorriane Shire M.D.   On: 12/26/2019 14:22    Review of Systems  HENT: Negative for ear discharge, ear pain, hearing loss and tinnitus.   Eyes: Negative for photophobia and pain.   Respiratory: Negative for cough and shortness of breath.   Cardiovascular: Negative for chest pain.  Gastrointestinal: Negative for abdominal pain, nausea and vomiting.  Genitourinary: Negative for dysuria, flank pain, frequency and urgency.  Musculoskeletal: Positive for arthralgias (Left hip). Negative for back pain, myalgias and neck pain.  Neurological: Negative for dizziness and headaches.  Hematological: Does not bruise/bleed easily.  Psychiatric/Behavioral: The patient is not nervous/anxious.    Blood pressure 122/82, pulse 90, temperature (!) 97.4 F (36.3 C), temperature source Oral, resp. rate 20, SpO2 99 %. Physical Exam  Constitutional: She appears well-developed and well-nourished. No distress.  HENT:  Head: Normocephalic and atraumatic.  Eyes: Conjunctivae are normal. Right eye exhibits no discharge. Left eye exhibits no discharge. No scleral icterus.  Cardiovascular: Normal rate and regular rhythm.  Respiratory: Effort normal. No respiratory distress.  Musculoskeletal:     Cervical back: Normal range of motion.     Comments: LLE No traumatic wounds, ecchymosis, or rash  Severe TTP hip  No knee or ankle effusion  Knee stable to varus/ valgus and anterior/posterior stress  Sens DPN, SPN, TN intact  Motor EHL, ext, flex, evers 5/5  DP 1+, PT 1+, No significant edema  Neurological: She is alert.  Skin: Skin is warm and dry. She is not diaphoretic.  Psychiatric: She has a normal mood and affect. Her behavior is normal.    Assessment/Plan: Left acet fx/dislocation -- Plan on CR and traction pin placement by Dr. Doreatha Martin. Definitive fixation planned tomorrow or Wednesday. Asthma    Lisette Abu, PA-C Orthopedic Surgery [phone] 12/26/2019, 2:48 PM

## 2019-12-26 NOTE — Progress Notes (Signed)
Orthopedic Tech Progress Note Patient Details:  Megan Foley 1981/02/22 [id number] Spoke with MD and he wanted me to drop off TRACTION set up for patient.  Patient ID: Megan Foley, female   DOB: 18-Jul-1981, 39 y.o.   MRN: [id number]   [name] 12/26/2019, 5:07 PM

## 2019-12-27 ENCOUNTER — Encounter (HOSPITAL_COMMUNITY): Payer: Self-pay | Admitting: Student

## 2019-12-27 ENCOUNTER — Other Ambulatory Visit: Payer: Self-pay

## 2019-12-27 LAB — CBC
HCT: 33.2 % — ABNORMAL LOW (ref 36.0–46.0)
Hemoglobin: 10.7 g/dL — ABNORMAL LOW (ref 12.0–15.0)
MCH: 30.1 pg (ref 26.0–34.0)
MCHC: 32.2 g/dL (ref 30.0–36.0)
MCV: 93.5 fL (ref 80.0–100.0)
Platelets: 266 10*3/uL (ref 150–400)
RBC: 3.55 MIL/uL — ABNORMAL LOW (ref 3.87–5.11)
RDW: 11.9 % (ref 11.5–15.5)
WBC: 13 10*3/uL — ABNORMAL HIGH (ref 4.0–10.5)
nRBC: 0 % (ref 0.0–0.2)

## 2019-12-27 LAB — VITAMIN D 25 HYDROXY (VIT D DEFICIENCY, FRACTURES): Vit D, 25-Hydroxy: 38.39 ng/mL (ref 30–100)

## 2019-12-27 MED ORDER — ACETAMINOPHEN 500 MG PO TABS
1000.0000 mg | ORAL_TABLET | Freq: Four times a day (QID) | ORAL | Status: DC
  Administered 2019-12-27 – 2019-12-30 (×12): 1000 mg via ORAL
  Filled 2019-12-27 (×14): qty 2

## 2019-12-27 MED ORDER — HYDROMORPHONE HCL 1 MG/ML IJ SOLN
1.0000 mg | INTRAMUSCULAR | Status: DC | PRN
  Administered 2019-12-27 (×2): 1 mg via INTRAVENOUS
  Filled 2019-12-27 (×2): qty 1

## 2019-12-27 MED ORDER — CEFAZOLIN SODIUM-DEXTROSE 2-4 GM/100ML-% IV SOLN
2.0000 g | INTRAVENOUS | Status: AC
  Administered 2019-12-28: 2 g via INTRAVENOUS
  Filled 2019-12-27 (×2): qty 100

## 2019-12-27 NOTE — Progress Notes (Signed)
Orthopaedic Trauma Progress Note  S: Doing okay this morning.  Pain currently well controlled.  Discussed procedure from yesterday as well as plans for procedure tomorrow and recovery thereafter.  All questions were answered.  No other concerns currently  O:  Vitals:   12/27/19 0342 12/27/19 0800  BP: 117/72 111/71  Pulse: 61 71  Resp: 17 17  Temp: 98 F (36.7 C) 98 F (36.7 C)  SpO2: 96% 98%    General - Laying in bed comfortably, no acute distress.  Awake alert and oriented Respiratory - No increased work of breathing.  Left lower extremity - Skeletal traction in place with 20 pounds.  Tender with palpation of the hip.  Ankle dorsiflexion and plantarflexion is intact.  Sensation intact to light touch distally.  Compartments are soft and compressible.  Neurovascularly intact.  Imaging: Stable post op imaging.   Labs:  Results for orders placed or performed during the hospital encounter of 12/26/19 (from the past 24 hour(s))  CBC with Differential     Status: Abnormal   Collection Time: 12/26/19  1:15 PM  Result Value Ref Range   WBC 12.8 (H) 4.0 - 10.5 K/uL   RBC 3.61 (L) 3.87 - 5.11 MIL/uL   Hemoglobin 11.1 (L) 12.0 - 15.0 g/dL   HCT 33.7 (L) 36.0 - 46.0 %   MCV 93.4 80.0 - 100.0 fL   MCH 30.7 26.0 - 34.0 pg   MCHC 32.9 30.0 - 36.0 g/dL   RDW 12.0 11.5 - 15.5 %   Platelets 255 150 - 400 K/uL   nRBC 0.0 0.0 - 0.2 %   Neutrophils Relative % 80 %   Neutro Abs 10.5 (H) 1.7 - 7.7 K/uL   Lymphocytes Relative 14 %   Lymphs Abs 1.8 0.7 - 4.0 K/uL   Monocytes Relative 4 %   Monocytes Absolute 0.5 0.1 - 1.0 K/uL   Eosinophils Relative 1 %   Eosinophils Absolute 0.1 0.0 - 0.5 K/uL   Basophils Relative 0 %   Basophils Absolute 0.0 0.0 - 0.1 K/uL   Immature Granulocytes 1 %   Abs Immature Granulocytes 0.07 0.00 - 0.07 K/uL  Basic metabolic panel     Status: Abnormal   Collection Time: 12/26/19  1:15 PM  Result Value Ref Range   Sodium 138 135 - 145 mmol/L   Potassium 3.4 (L)  3.5 - 5.1 mmol/L   Chloride 103 98 - 111 mmol/L   CO2 23 22 - 32 mmol/L   Glucose, Bld 106 (H) 70 - 99 mg/dL   BUN 11 6 - 20 mg/dL   Creatinine, Ser 0.63 0.44 - 1.00 mg/dL   Calcium 9.0 8.9 - 10.3 mg/dL   GFR calc non Af Amer >60 >60 mL/min   GFR calc Af Amer >60 >60 mL/min   Anion gap 12 5 - 15  I-Stat beta hCG blood, ED     Status: None   Collection Time: 12/26/19  1:18 PM  Result Value Ref Range   I-stat hCG, quantitative <5.0 <5 mIU/mL   Comment 3          Respiratory Panel by RT PCR (Flu A&B, Covid) - Nasopharyngeal Swab     Status: None   Collection Time: 12/26/19  3:15 PM   Specimen: Nasopharyngeal Swab  Result Value Ref Range   SARS Coronavirus 2 by RT PCR NEGATIVE NEGATIVE   Influenza A by PCR NEGATIVE NEGATIVE   Influenza B by PCR NEGATIVE NEGATIVE  HIV Antibody (routine testing  w rflx)     Status: None   Collection Time: 12/26/19  7:02 PM  Result Value Ref Range   HIV Screen 4th Generation wRfx NON REACTIVE NON REACTIVE    Assessment: 39 year old female status post fall, 1 Day Post-Op   Injuries: Left posterior wall acetabular fracture-dislocation s/p closed reduction with placement of distal femoral traction pin  Weightbearing: Bedrest   Insicional and dressing care: Dressings left intact until follow-up   Orthopedic device(s): Skeletal traction LLE   CV/Blood loss: Hemoglobin 11.1 on admission.  CBC pending this morning. Hemodynamically stable  Pain management:  1. Tylenol 650 mg q 6 hours scheduled 2. Robaxin 500 mg q 6 hours PRN 3. Oxycodone 5-15 mg q 4 hours PRN 4. Dilaudid 1 mg q 3 hours PRN  VTE prophylaxis: Lovenox starting today.  We will continue this x30 days at discharge SCDs: In place bilateral lower extremities  Foley/Lines:  No foley, KVO IVFs  Medical co-morbidities: None  Impediments to Fracture Healing: Vitamin D level pending, will start supplementation as indicated  Dispo: Bedrest for now.  Plan for ORIF left acetabulum fracture  tomorrow morning.  Will be n.p.o. after midnight.  PT/OT eval postoperatively.  Follow - up plan: TBD  Contact information:  Truitt Merle MD, Ulyses Southward PA-C   Liyla Radliff A. Ladonna Snide [facility] [phone] (office) [email or web address]

## 2019-12-27 NOTE — Plan of Care (Signed)

## 2019-12-27 NOTE — Progress Notes (Signed)
Pt stable. Pain under control. Small bruises scattered along legs. Overall skin is good. Password: Pharmacist, community. Will continue to monitor.

## 2019-12-28 ENCOUNTER — Encounter (HOSPITAL_COMMUNITY)
Admission: EM | Disposition: A | Discharge status: Home Health | Payer: Self-pay | Source: Home / Self Care | Attending: Student

## 2019-12-28 ENCOUNTER — Inpatient Hospital Stay (HOSPITAL_COMMUNITY): Payer: No Typology Code available for payment source | Admitting: Anesthesiology

## 2019-12-28 ENCOUNTER — Inpatient Hospital Stay (HOSPITAL_COMMUNITY): Payer: No Typology Code available for payment source

## 2019-12-28 ENCOUNTER — Encounter (HOSPITAL_COMMUNITY): Payer: Self-pay | Admitting: Student

## 2019-12-28 HISTORY — PX: OPEN REDUCTION INTERNAL FIXATION ACETABULUM POSTERIOR LATERAL: SHX6834

## 2019-12-28 LAB — CBC
HCT: 30.7 % — ABNORMAL LOW (ref 36.0–46.0)
Hemoglobin: 9.8 g/dL — ABNORMAL LOW (ref 12.0–15.0)
MCH: 30.2 pg (ref 26.0–34.0)
MCHC: 31.9 g/dL (ref 30.0–36.0)
MCV: 94.8 fL (ref 80.0–100.0)
Platelets: 218 10*3/uL (ref 150–400)
RBC: 3.24 MIL/uL — ABNORMAL LOW (ref 3.87–5.11)
RDW: 12.3 % (ref 11.5–15.5)
WBC: 8.6 10*3/uL (ref 4.0–10.5)
nRBC: 0 % (ref 0.0–0.2)

## 2019-12-28 LAB — SURGICAL PCR SCREEN
MRSA, PCR: NEGATIVE
Staphylococcus aureus: POSITIVE — AB

## 2019-12-28 SURGERY — OPEN REDUCTION INTERNAL FIXATION ACETABULUM POSTERIOR LATERAL
Anesthesia: General | Site: Hip | Laterality: Left

## 2019-12-28 MED ORDER — LACTATED RINGERS IV SOLN: INTRAVENOUS | Status: DC

## 2019-12-28 MED ORDER — 0.9 % SODIUM CHLORIDE (POUR BTL) OPTIME
TOPICAL | Status: DC | PRN
  Administered 2019-12-28: 11:00:00 1000 mL

## 2019-12-28 MED ORDER — SUGAMMADEX SODIUM 200 MG/2ML IV SOLN
INTRAVENOUS | Status: DC | PRN
  Administered 2019-12-28: 160 mg via INTRAVENOUS

## 2019-12-28 MED ORDER — OXYCODONE HCL 5 MG/5ML PO SOLN: 5.0000 mg | Freq: Once | ORAL | Status: AC | PRN

## 2019-12-28 MED ORDER — FENTANYL CITRATE (PF) 250 MCG/5ML IJ SOLN
INTRAMUSCULAR | Status: AC
  Filled 2019-12-28: qty 5

## 2019-12-28 MED ORDER — ONDANSETRON HCL 4 MG/2ML IJ SOLN
INTRAMUSCULAR | Status: DC | PRN
  Administered 2019-12-28: 4 mg via INTRAVENOUS

## 2019-12-28 MED ORDER — PHENYLEPHRINE 40 MCG/ML (10ML) SYRINGE FOR IV PUSH (FOR BLOOD PRESSURE SUPPORT)
PREFILLED_SYRINGE | INTRAVENOUS | Status: DC | PRN
  Administered 2019-12-28: 80 ug via INTRAVENOUS
  Administered 2019-12-28: 40 ug via INTRAVENOUS

## 2019-12-28 MED ORDER — OXYCODONE HCL 5 MG PO TABS
5.0000 mg | ORAL_TABLET | Freq: Once | ORAL | Status: AC | PRN
  Administered 2019-12-28: 13:00:00 5 mg via ORAL

## 2019-12-28 MED ORDER — CEFAZOLIN SODIUM-DEXTROSE 2-4 GM/100ML-% IV SOLN
2.0000 g | Freq: Three times a day (TID) | INTRAVENOUS | Status: AC
  Administered 2019-12-28 – 2019-12-29 (×3): 2 g via INTRAVENOUS
  Filled 2019-12-28 (×4): qty 100

## 2019-12-28 MED ORDER — MIDAZOLAM HCL 2 MG/2ML IJ SOLN
INTRAMUSCULAR | Status: AC
  Filled 2019-12-28: qty 2

## 2019-12-28 MED ORDER — ROCURONIUM BROMIDE 10 MG/ML (PF) SYRINGE
PREFILLED_SYRINGE | INTRAVENOUS | Status: DC | PRN
  Administered 2019-12-28: 20 mg via INTRAVENOUS
  Administered 2019-12-28: 50 mg via INTRAVENOUS

## 2019-12-28 MED ORDER — HYDROMORPHONE HCL 1 MG/ML IJ SOLN
INTRAMUSCULAR | Status: AC
  Filled 2019-12-28: qty 1

## 2019-12-28 MED ORDER — ONDANSETRON HCL 4 MG/2ML IJ SOLN
INTRAMUSCULAR | Status: AC
  Filled 2019-12-28: qty 2

## 2019-12-28 MED ORDER — FENTANYL CITRATE (PF) 250 MCG/5ML IJ SOLN
INTRAMUSCULAR | Status: DC | PRN
  Administered 2019-12-28 (×5): 50 ug via INTRAVENOUS

## 2019-12-28 MED ORDER — LIDOCAINE 2% (20 MG/ML) 5 ML SYRINGE
INTRAMUSCULAR | Status: AC
  Filled 2019-12-28: qty 5

## 2019-12-28 MED ORDER — VANCOMYCIN HCL 1000 MG IV SOLR
INTRAVENOUS | Status: AC
  Filled 2019-12-28: qty 1000

## 2019-12-28 MED ORDER — GLYCOPYRROLATE PF 0.2 MG/ML IJ SOSY
PREFILLED_SYRINGE | INTRAMUSCULAR | Status: DC | PRN
  Administered 2019-12-28: .2 mg via INTRAVENOUS

## 2019-12-28 MED ORDER — PROPOFOL 10 MG/ML IV BOLUS
INTRAVENOUS | Status: AC
  Filled 2019-12-28: qty 20

## 2019-12-28 MED ORDER — GLYCOPYRROLATE PF 0.2 MG/ML IJ SOSY
PREFILLED_SYRINGE | INTRAMUSCULAR | Status: AC
  Filled 2019-12-28: qty 1

## 2019-12-28 MED ORDER — PHENYLEPHRINE 40 MCG/ML (10ML) SYRINGE FOR IV PUSH (FOR BLOOD PRESSURE SUPPORT)
PREFILLED_SYRINGE | INTRAVENOUS | Status: AC
  Filled 2019-12-28: qty 10

## 2019-12-28 MED ORDER — GABAPENTIN 100 MG PO CAPS
100.0000 mg | ORAL_CAPSULE | Freq: Three times a day (TID) | ORAL | Status: DC
  Administered 2019-12-28 – 2019-12-30 (×7): 100 mg via ORAL
  Filled 2019-12-28 (×7): qty 1

## 2019-12-28 MED ORDER — HYDROMORPHONE HCL 1 MG/ML IJ SOLN
0.2500 mg | INTRAMUSCULAR | Status: DC | PRN
  Administered 2019-12-28: 0.25 mg via INTRAVENOUS
  Administered 2019-12-28: 0.5 mg via INTRAVENOUS
  Administered 2019-12-28 (×3): 0.25 mg via INTRAVENOUS
  Administered 2019-12-28: 14:00:00 0.5 mg via INTRAVENOUS

## 2019-12-28 MED ORDER — KETOROLAC TROMETHAMINE 15 MG/ML IJ SOLN
15.0000 mg | Freq: Four times a day (QID) | INTRAMUSCULAR | Status: AC
  Administered 2019-12-29 (×4): 15 mg via INTRAVENOUS
  Filled 2019-12-28 (×5): qty 1

## 2019-12-28 MED ORDER — PROPOFOL 10 MG/ML IV BOLUS
INTRAVENOUS | Status: DC | PRN
  Administered 2019-12-28: 200 mg via INTRAVENOUS

## 2019-12-28 MED ORDER — VANCOMYCIN HCL 1 G IV SOLR
INTRAVENOUS | Status: DC | PRN
  Administered 2019-12-28: 1000 mg via TOPICAL

## 2019-12-28 MED ORDER — DEXAMETHASONE SODIUM PHOSPHATE 10 MG/ML IJ SOLN
INTRAMUSCULAR | Status: AC
  Filled 2019-12-28: qty 1

## 2019-12-28 MED ORDER — HYDROMORPHONE HCL 1 MG/ML IJ SOLN
INTRAMUSCULAR | Status: DC | PRN
  Administered 2019-12-28 (×3): .5 mg via INTRAVENOUS

## 2019-12-28 MED ORDER — DEXAMETHASONE SODIUM PHOSPHATE 10 MG/ML IJ SOLN
INTRAMUSCULAR | Status: DC | PRN
  Administered 2019-12-28: 4 mg via INTRAVENOUS

## 2019-12-28 MED ORDER — ENOXAPARIN SODIUM 40 MG/0.4ML ~~LOC~~ SOLN
40.0000 mg | SUBCUTANEOUS | Status: DC
  Administered 2019-12-29 – 2019-12-30 (×2): 40 mg via SUBCUTANEOUS
  Filled 2019-12-28 (×3): qty 0.4

## 2019-12-28 MED ORDER — ROCURONIUM BROMIDE 10 MG/ML (PF) SYRINGE
PREFILLED_SYRINGE | INTRAVENOUS | Status: AC
  Filled 2019-12-28: qty 10

## 2019-12-28 MED ORDER — ALBUMIN HUMAN 5 % IV SOLN: INTRAVENOUS | Status: DC | PRN

## 2019-12-28 MED ORDER — ONDANSETRON HCL 4 MG/2ML IJ SOLN: 4.0000 mg | Freq: Once | INTRAMUSCULAR | Status: DC | PRN

## 2019-12-28 MED ORDER — MIDAZOLAM HCL 5 MG/5ML IJ SOLN
INTRAMUSCULAR | Status: DC | PRN
  Administered 2019-12-28: 2 mg via INTRAVENOUS

## 2019-12-28 MED ORDER — OXYCODONE HCL 5 MG PO TABS
ORAL_TABLET | ORAL | Status: AC
  Filled 2019-12-28: qty 1

## 2019-12-28 MED ORDER — CEFAZOLIN SODIUM-DEXTROSE 2-4 GM/100ML-% IV SOLN
2.0000 g | Freq: Three times a day (TID) | INTRAVENOUS | Status: DC
  Filled 2019-12-28: qty 100

## 2019-12-28 MED ORDER — LIDOCAINE 2% (20 MG/ML) 5 ML SYRINGE
INTRAMUSCULAR | Status: DC | PRN
  Administered 2019-12-28: 100 mg via INTRAVENOUS

## 2019-12-28 SURGICAL SUPPLY — 76 items
APL PRP STRL LF DISP 70% ISPRP (MISCELLANEOUS) ×2
APL SKNCLS STERI-STRIP NONHPOA (GAUZE/BANDAGES/DRESSINGS) ×1
BENZOIN TINCTURE PRP APPL 2/3 (GAUZE/BANDAGES/DRESSINGS) ×2 IMPLANT
BIT DRILL 2.5X300 (BIT) IMPLANT
BIT DRILL STEP 3.5 (DRILL) IMPLANT
BLADE CLIPPER SURG (BLADE) IMPLANT
BRUSH SCRUB EZ PLAIN DRY (MISCELLANEOUS) ×6 IMPLANT
CHLORAPREP W/TINT 26 (MISCELLANEOUS) ×5 IMPLANT
CLOSURE WOUND 1/2 X4 (GAUZE/BANDAGES/DRESSINGS) ×2
COVER SURGICAL LIGHT HANDLE (MISCELLANEOUS) ×3 IMPLANT
COVER WAND RF STERILE (DRAPES) ×3 IMPLANT
DRAPE C-ARM 42X72 X-RAY (DRAPES) ×3 IMPLANT
DRAPE C-ARMOR (DRAPES) ×3 IMPLANT
DRAPE INCISE IOBAN 66X45 STRL (DRAPES) ×3 IMPLANT
DRAPE INCISE IOBAN 85X60 (DRAPES) ×3 IMPLANT
DRAPE ORTHO SPLIT 77X108 STRL (DRAPES) ×6
DRAPE SURG ORHT 6 SPLT 77X108 (DRAPES) ×2 IMPLANT
DRAPE U-SHAPE 47X51 STRL (DRAPES) ×3 IMPLANT
DRILL BIT 2.5X300 (BIT) ×3
DRILL STEP 3.5 (DRILL)
DRSG MEPILEX BORDER 4X12 (GAUZE/BANDAGES/DRESSINGS) ×2 IMPLANT
DRSG MEPILEX BORDER 4X8 (GAUZE/BANDAGES/DRESSINGS) IMPLANT
ELECT BLADE 4.0 EZ CLEAN MEGAD (MISCELLANEOUS) ×3
ELECT BLADE 6.5 EXT (BLADE) ×3 IMPLANT
ELECT REM PT RETURN 9FT ADLT (ELECTROSURGICAL) ×3
ELECTRODE BLDE 4.0 EZ CLN MEGD (MISCELLANEOUS) IMPLANT
ELECTRODE REM PT RTRN 9FT ADLT (ELECTROSURGICAL) ×1 IMPLANT
GLOVE BIO SURGEON STRL SZ 6.5 (GLOVE) ×6 IMPLANT
GLOVE BIO SURGEON STRL SZ7.5 (GLOVE) ×12 IMPLANT
GLOVE BIO SURGEONS STRL SZ 6.5 (GLOVE) ×3
GLOVE BIOGEL PI IND STRL 6.5 (GLOVE) ×1 IMPLANT
GLOVE BIOGEL PI IND STRL 7.5 (GLOVE) ×1 IMPLANT
GLOVE BIOGEL PI INDICATOR 6.5 (GLOVE) ×2
GLOVE BIOGEL PI INDICATOR 7.5 (GLOVE) ×2
GOWN STRL REUS W/ TWL LRG LVL3 (GOWN DISPOSABLE) ×2 IMPLANT
GOWN STRL REUS W/TWL LRG LVL3 (GOWN DISPOSABLE) ×6
HANDPIECE INTERPULSE COAX TIP (DISPOSABLE) ×3
KIT BASIN OR (CUSTOM PROCEDURE TRAY) ×3 IMPLANT
KIT TURNOVER KIT B (KITS) ×3 IMPLANT
MANIFOLD NEPTUNE II (INSTRUMENTS) ×3 IMPLANT
NS IRRIG 1000ML POUR BTL (IV SOLUTION) ×3 IMPLANT
PACK TOTAL JOINT (CUSTOM PROCEDURE TRAY) ×3 IMPLANT
PAD ARMBOARD 7.5X6 YLW CONV (MISCELLANEOUS) ×6 IMPLANT
PLATE BONE 91MM 7HOLE PELVIC (Plate) ×2 IMPLANT
RETRIEVER SUT HEWSON (MISCELLANEOUS) ×3 IMPLANT
SCREW CORTEX 3.5 32MM (Screw) ×2 IMPLANT
SCREW CORTEX 3.5 34MM (Screw) ×2 IMPLANT
SCREW CORTEX 3.5X45MM (Screw) ×2 IMPLANT
SCREW LOCK CORT ST 3.5X32 (Screw) IMPLANT
SCREW LOCK CORT ST 3.5X34 (Screw) IMPLANT
SCREW PELVIC CORT ST 3.5X55 (Screw) IMPLANT
SCREW SELF TAP 3.5 55M (Screw) ×2 IMPLANT
SET HNDPC FAN SPRY TIP SCT (DISPOSABLE) ×1 IMPLANT
SLEEVE SUCTION CATH 165 (SLEEVE) ×2 IMPLANT
SPONGE LAP 18X18 RF (DISPOSABLE) IMPLANT
STAPLER VISISTAT 35W (STAPLE) ×3 IMPLANT
STRIP CLOSURE SKIN 1/2X4 (GAUZE/BANDAGES/DRESSINGS) ×2 IMPLANT
SUCTION FRAZIER HANDLE 10FR (MISCELLANEOUS) ×2
SUCTION TUBE FRAZIER 10FR DISP (MISCELLANEOUS) ×1 IMPLANT
SUT ETHILON 2 0 PSLX (SUTURE) ×6 IMPLANT
SUT FIBERWIRE #2 38 T-5 BLUE (SUTURE) ×6
SUT MNCRL AB 3-0 PS2 18 (SUTURE) ×3 IMPLANT
SUT MON AB 2-0 CT1 36 (SUTURE) ×3 IMPLANT
SUT VIC AB 0 CT1 27 (SUTURE) ×9
SUT VIC AB 0 CT1 27XBRD ANBCTR (SUTURE) ×1 IMPLANT
SUT VIC AB 1 CT1 18XCR BRD 8 (SUTURE) ×1 IMPLANT
SUT VIC AB 1 CT1 27 (SUTURE) ×6
SUT VIC AB 1 CT1 27XBRD ANBCTR (SUTURE) ×1 IMPLANT
SUT VIC AB 1 CT1 8-18 (SUTURE) ×3
SUT VIC AB 2-0 CT1 27 (SUTURE) ×3
SUT VIC AB 2-0 CT1 TAPERPNT 27 (SUTURE) ×1 IMPLANT
SUTURE FIBERWR #2 38 T-5 BLUE (SUTURE) ×2 IMPLANT
TOWEL GREEN STERILE (TOWEL DISPOSABLE) ×6 IMPLANT
TOWEL GREEN STERILE FF (TOWEL DISPOSABLE) ×6 IMPLANT
TRAY FOLEY MTR SLVR 16FR STAT (SET/KITS/TRAYS/PACK) IMPLANT
WATER STERILE IRR 1000ML POUR (IV SOLUTION) IMPLANT

## 2019-12-28 NOTE — Anesthesia Preprocedure Evaluation (Signed)
Anesthesia Evaluation  Patient identified by MRN, date of birth, ID band Patient awake    Reviewed: Allergy & Precautions, NPO status , Patient's Chart, lab work & pertinent test results  History of Anesthesia Complications Negative for: history of anesthetic complications  Airway Mallampati: I  TM Distance: >3 FB Neck ROM: Full    Dental  (+) Teeth Intact   Pulmonary asthma (rescue inhaler only) ,    Pulmonary exam normal        Cardiovascular negative cardio ROS Normal cardiovascular exam     Neuro/Psych PSYCHIATRIC DISORDERS Anxiety Depression negative neurological ROS     GI/Hepatic negative GI ROS, Neg liver ROS,   Endo/Other  negative endocrine ROS  Renal/GU negative Renal ROS  negative genitourinary   Musculoskeletal negative musculoskeletal ROS (+)   Abdominal   Peds  (+) ADHD Hematology negative hematology ROS (+)   Anesthesia Other Findings   Reproductive/Obstetrics                             Anesthesia Physical Anesthesia Plan  ASA: II  Anesthesia Plan: General   Post-op Pain Management:    Induction: Intravenous  PONV Risk Score and Plan: 3 and Ondansetron, Dexamethasone, Treatment may vary due to age or medical condition and Midazolam  Airway Management Planned: Oral ETT  Additional Equipment: None  Intra-op Plan:   Post-operative Plan: Extubation in OR  Informed Consent: I have reviewed the patients History and Physical, chart, labs and discussed the procedure including the risks, benefits and alternatives for the proposed anesthesia with the patient or authorized representative who has indicated his/her understanding and acceptance.     Dental advisory given  Plan Discussed with:   Anesthesia Plan Comments:         Anesthesia Quick Evaluation

## 2019-12-28 NOTE — Plan of Care (Signed)

## 2019-12-28 NOTE — Op Note (Signed)
Orthopaedic Surgery Operative Note (CSN: [id number] ) Date of Surgery: 12/28/2019  Admit Date: 12/26/2019   Diagnoses: Pre-Op Diagnoses: Left posterior wall acetabular fracture dislocation  Post-Op Diagnosis: Same  Procedures: 1. CPT 27254-Open reduction internal fixation of left posterior wall acetabular fracture 2. CPT 20670-Removal of traction pin  Surgeons : Primary: Roby Lofts, MD  Assistant: Ulyses Southward, PA-C  Location: OR 3   Anesthesia:General  Antibiotics: Ancef 2g preop   Tourniquet time: None  Estimated Blood Loss:100 mL  Complications: None  Specimens:* No specimens in log *   Implants: Implant Name Type Inv. Item Serial No. Manufacturer Lot No. LRB No. Used Action  PLATE BONE 66YQ 7HOLE PELVIC - [id number] Plate PLATE BONE 63OV 7HOLE PELVIC  [employer]  Left 1 Implanted  SCREW CORTEX 3.5 - [id number] Screw SCREW CORTEX 3.5  [employer]  Left 1 Implanted  SCREW SELF TAP 3.5 32M - [id number] Screw SCREW SELF TAP 3.5 32M  [employer]  Left 1 Implanted  SCREW CORTEX 3.5X45MM - [id number] Screw SCREW CORTEX 3.5X45MM  [employer]  Left 1 Implanted  SCREW CORTEX 3.5 - [id number] Screw SCREW CORTEX 3.5  [employer]  Left 1 Implanted     Indications for Surgery: 39 year old female who tripped and fell while at work.  She sustained a left posterior wall acetabular fracture dislocation.  We took her emergently 2 days ago for close reduction and traction pin placement.  Post reduction CT was obtained.  I recommended proceeding with open reduction internal fixation of her posterior wall acetabular fracture.  Risks and benefits were discussed with the patient.  Risks include but not limited to bleeding, infection, malunion, nonunion, posttraumatic arthritis, stiffness of the hip, heterotopic ossification, nerve and blood vessel injury, avascular necrosis, DVT, even the possibility anesthetic complications.  She agreed to proceed with surgery  and consent was obtained.  Operative Findings: 1.  Open reduction internal fixation of left posterior wall acetabular fracture dislocation using Synthes 7 hole 3.5 mm recon plate 2.  Mild impaction of the articular surface at the inferior portion of the acetabulum was reduced 3.  Removal of distal femoral traction pin.  Procedure: The patient was identified in the preoperative holding area. Consent was confirmed with the patient and their family and all questions were answered. The operative extremity was marked after confirmation with the patient. she was then brought back to the operating room by our anesthesia colleagues.  She was placed under general anesthetic and carefully transferred over to a radiolucent flat top table.  She was carefully positioned in the lateral decubitus position with her left side up.  An axillary roll was placed to keep pressure off of her nerves.  All bony prominences were well-padded.  The beanbag was deflated to hold her in position.  Fluoroscopic imaging was obtained that showed the large posterior wall acetabular fracture.  The distal femoral traction pin was removed.  The left lower extremity was then prepped and draped in usual sterile fashion.  A timeout was performed to verify the patient, the procedure, and the extremity.  Preoperative antibiotics were dosed.  The standard posterior lateral approach to the acetabulum was made.  Is carried down through skin and subcutaneous tissue.  I split the IT band in line with my incision and identified the gluteus fascia and split that in line with the muscle fibers of the gluteus maximus.  I split the gluteus maximus muscle fibers in line with the incision as well.  I then bluntly dissected to visualize the sciatic nerve.  I visualized and protected this throughout the case.  I then identified tagged and reflected the piriformis tendon and the obturator internus tendon off the greater trochanter.  I used these to assist with  retraction and I repaired these at the end of the case.  Some excisional debridement was performed to the external rotators as well as the piriformis to better visualize the fracture.  Excisional debridement of traumatized gluteus minimus musculature to decrease the risk of heterotopic ossification was performed.  The large posterior wall was reflected superiorly to be able to access the joint.  There was some inferior comminution and a very small osteochondral fragment that was excised.  At the inferior portion of the acetabulum there was a small amount of impaction that I used a Cobb elevator to be reduced.  This held in position without any difficulty.  The leg was tractioned and the joint was irrigated out and and no loose bodies were identified.  The the femoral head was concentrically reduced in the acetabulum.  I then subsequently released the posterior wall anatomically.  The amount of cortical comminution inferiorly made a read difficult however I was able to visualize a cortical read on the intact ilium more superiorly and was able to reduce it anatomically.  I used a ball spike pusher to hold this in place while I held it provisionally with a 1.6 mm K wire.  I confirmed anatomic reduction of the acetabulum with fluoroscopy.  I then contoured a 7 hole Synthes reconstruction plate to fit along the posterior wall.  I first fixed the plate to the ischial tuberosity with 3.5 millimeter screws.  I then in situ contoured the plate to provide an adequate buttress to the posterior wall and then drilled and placed 3.5 millimeter screws into the intact ilium.  The K wires were subsequently removed.  Fluoroscopic imaging was used to confirm anatomic reduction and that all of the screws were extra-articular.  Final fluoroscopic images were obtained.  The incision was copiously irrigated.  Drill holes were made in the greater trochanter and the tag sutures for the piriformis and obturator internus were pulled  through and tied down to repair these.  A gram of vancomycin powder was placed into the incision.  The IT band was closed with #1 Vicryl suture.  The skin was closed with 0 Vicryl, 2-0 Vicryl and 3-0 Monocryl.  The skin was sealed with Dermabond.  A sterile dressing was placed.  The patient was then placed supine transferred to a regular bed and taken to the PACU in stable condition.  Post Op Plan/Instructions: Patient will be touchdown weightbearing to left lower extremity.  She will receive postoperative antibiotics.  She will receive Lovenox for DVT prophylaxis starting postoperative day 1.  We will mobilize her with physical and Occupational Therapy.  I was present and performed the entire surgery.  Patrecia Pace, PA-C did assist me throughout the case. An assistant was necessary given the difficulty in approach, maintenance of reduction and ability to instrument the fracture.   Katha Hamming, MD [facility]

## 2019-12-28 NOTE — Anesthesia Postprocedure Evaluation (Signed)
Anesthesia Post Note  Patient: Megan Foley  Procedure(s) Performed: OPEN REDUCTION INTERNAL FIXATION ACETABULUM POSTERIOR LATERAL (Left Hip)     Patient location during evaluation: PACU Anesthesia Type: General Level of consciousness: awake and alert Pain management: pain level controlled Vital Signs Assessment: post-procedure vital signs reviewed and stable Respiratory status: spontaneous breathing, nonlabored ventilation and respiratory function stable Cardiovascular status: blood pressure returned to baseline and stable Postop Assessment: no apparent nausea or vomiting Anesthetic complications: no    Last Vitals:  Vitals:   12/28/19 1330 12/28/19 1335  BP: 114/69 114/69  Pulse:  95  Resp:  19  Temp:    SpO2:  100%    Last Pain:  Vitals:   12/28/19 1320  TempSrc:   PainSc: 10-Worst pain ever                 Lucretia Kern

## 2019-12-28 NOTE — Transfer of Care (Signed)
Immediate Anesthesia Transfer of Care Note  Patient: Megan Foley  Procedure(s) Performed: OPEN REDUCTION INTERNAL FIXATION ACETABULUM POSTERIOR LATERAL (Left Hip)  Patient Location: PACU  Anesthesia Type:General  Level of Consciousness: awake and patient cooperative  Airway & Oxygen Therapy: Patient Spontanous Breathing and Patient connected to nasal cannula oxygen  Post-op Assessment: Report given to RN and Post -op Vital signs reviewed and stable  Post vital signs: Reviewed and stable  Last Vitals:  Vitals Value Taken Time  BP 106/66 12/28/19 1252  Temp    Pulse 88 12/28/19 1258  Resp 12 12/28/19 1258  SpO2 100 % 12/28/19 1258  Vitals shown include unvalidated device data.  Last Pain:  Vitals:   12/28/19 0735  TempSrc: Oral  PainSc:       Patients Stated Pain Goal: 3 (12/28/19 0520)  Complications: No apparent anesthesia complications

## 2019-12-28 NOTE — Discharge Instructions (Signed)
[facility] Discharge Instructions   General Discharge Instructions  WEIGHT BEARING STATUS: Touchdown weightbearing left leg  RANGE OF MOTION/ACTIVITY: Okay for gentle hip range of motion  Wound Care: Incisions can be left open to air if there is no drainage. If incision continues to have drainage, follow wound care instructions below. Okay to shower if no drainage from incisions.  DVT/PE prophylaxis: Lovenox x 30 days  Diet: as you were eating previously.  Can use over the counter stool softeners and bowel preparations, such as Miralax, to help with bowel movements.  Narcotics can be constipating.  Be sure to drink plenty of fluids  PAIN MEDICATION USE AND EXPECTATIONS  You have likely been given narcotic medications to help control your pain.  After a traumatic event that results in an fracture (broken bone) with or without surgery, it is ok to use narcotic pain medications to help control one's pain.  We understand that everyone responds to pain differently and each individual patient will be evaluated on a regular basis for the continued need for narcotic medications. Ideally, narcotic medication use should last no more than 6-8 weeks (coinciding with fracture healing).   As a patient it is your responsibility as well to monitor narcotic medication use and report the amount and frequency you use these medications when you come to your office visit.   We would also advise that if you are using narcotic medications, you should take a dose prior to therapy to maximize you participation.  IF YOU ARE ON NARCOTIC MEDICATIONS IT IS NOT PERMISSIBLE TO OPERATE A MOTOR VEHICLE (MOTORCYCLE/CAR/TRUCK/MOPED) OR HEAVY MACHINERY DO NOT MIX NARCOTICS WITH OTHER CNS (CENTRAL NERVOUS SYSTEM) DEPRESSANTS SUCH AS ALCOHOL   STOP SMOKING OR USING NICOTINE PRODUCTS!!!!  As discussed nicotine severely impairs your body's ability to heal surgical and traumatic wounds but also impairs bone  healing.  Wounds and bone heal by forming microscopic blood vessels (angiogenesis) and nicotine is a vasoconstrictor (essentially, shrinks blood vessels).  Therefore, if vasoconstriction occurs to these microscopic blood vessels they essentially disappear and are unable to deliver necessary nutrients to the healing tissue.  This is one modifiable factor that you can do to dramatically increase your chances of healing your injury.    (This means no smoking, no nicotine gum, patches, etc)  DO NOT USE NONSTEROIDAL ANTI-INFLAMMATORY DRUGS (NSAID'S)  Using products such as Advil (ibuprofen), Aleve (naproxen), Motrin (ibuprofen) for additional pain control during fracture healing can delay and/or prevent the healing response.  If you would like to take over the counter (OTC) medication, Tylenol (acetaminophen) is ok.  However, some narcotic medications that are given for pain control contain acetaminophen as well. Therefore, you should not exceed more than 4000 mg of tylenol in a day if you do not have liver disease.  Also note that there are may OTC medicines, such as cold medicines and allergy medicines that my contain tylenol as well.  If you have any questions about medications and/or interactions please ask your doctor/PA or your pharmacist.      ICE AND ELEVATE INJURED/OPERATIVE EXTREMITY  Using ice and elevating the injured extremity above your heart can help with swelling and pain control.  Icing in a pulsatile fashion, such as 20 minutes on and 20 minutes off, can be followed.    Do not place ice directly on skin. Make sure there is a barrier between to skin and the ice pack.    Using frozen items such as frozen peas works  well as the conform nicely to the are that needs to be iced.  USE AN ACE WRAP OR TED HOSE FOR SWELLING CONTROL  In addition to icing and elevation, Ace wraps or TED hose are used to help limit and resolve swelling.  It is recommended to use Ace wraps or TED hose until you are  informed to stop.    When using Ace Wraps start the wrapping distally (farthest away from the body) and wrap proximally (closer to the body)   Example: If you had surgery on your leg or thing and you do not have a splint on, start the ace wrap at the toes and work your way up to the thigh        If you had surgery on your upper extremity and do not have a splint on, start the ace wrap at your fingers and work your way up to the upper arm     [place]: [phone]   VISIT OUR WEBSITE FOR ADDITIONAL INFORMATION: [email or web address]    Discharge Wound Care Instructions  Do NOT apply any ointments, solutions or lotions to pin sites or surgical wounds.  These prevent needed drainage and even though solutions like hydrogen peroxide kill bacteria, they also damage cells lining the pin sites that help fight infection.  Applying lotions or ointments can keep the wounds moist and can cause them to breakdown and open up as well. This can increase the risk for infection. When in doubt call the office.  Surgical incisions should be dressed daily.  If any drainage is noted, use one layer of adaptic, then gauze, Kerlix, and an ace wrap.  Once the incision is completely dry and without drainage, it may be left open to air out.  Showering may begin 36-48 hours later.  Cleaning gently with soap and water.  Traumatic wounds should be dressed daily as well.    One layer of adaptic, gauze, Kerlix, then ace wrap.  The adaptic can be discontinued once the draining has ceased    If you have a wet to dry dressing: wet the gauze with saline the squeeze as much saline out so the gauze is moist (not soaking wet), place moistened gauze over wound, then place a dry gauze over the moist one, followed by Kerlix wrap, then ace wrap.

## 2019-12-28 NOTE — Evaluation (Signed)
Physical Therapy Evaluation Patient Details Name: Megan Foley MRN: [id number] DOB: [birth year] Today's Date: 12/28/2019   History of Present Illness  Pt is 39 yo female who tripped over playground equipment at work and fell.  She sustained L posterior wall acetabular fracture/dislocation and is s/p closed reduction of hip dislocation and placement of traction pin on 2/8, and now ORIF of acetabular fx and removal of traction pin on 2/10.  Pt with TDWB status.  Clinical Impression  Pt admitted with above diagnosis. Pt required increased time and cues for transfers and gait due to pain (She had not had meds since PACU due to going for CT, but wanted to get up to bathroom , RN gave meds post PT).  She was able to transfer with min A and ambulated 15' with min A and RW.  Pt maintained NWB during therapy (cued for TDWB).  Pt with good family support.  Pt currently with functional limitations due to the deficits listed below (see PT Problem List). Pt will benefit from skilled PT to increase their independence and safety with mobility to allow discharge to the venue listed below.       Follow Up Recommendations Home health PT;Supervision/Assistance - 24 hour    Equipment Recommendations  Rolling walker with 5" wheels;3in1 (PT)(tub bench; possible cane (stairs only have 1 rail))    Recommendations for Other Services       Precautions / Restrictions Precautions Precautions: Fall Restrictions Weight Bearing Restrictions: Yes LLE Weight Bearing: Non weight bearing      Mobility  Bed Mobility Overal bed mobility: Needs Assistance Bed Mobility: Supine to Sit;Sit to Supine     Supine to sit: Min assist;HOB elevated Sit to supine: Min assist;HOB elevated   General bed mobility comments: increased time, use of bed rail, cues for sequence, limited by pain; demonstrated how to use gait belt to assist with transfers as leg lifter  Transfers Overall transfer level: Needs assistance Equipment  used: Rolling walker (2 wheeled) Transfers: Sit to/from Stand Sit to Stand: Min assist         General transfer comment: cues for technique and TDWB; min A to boost; performed x 2 (from bed and toilet); increased time  Ambulation/Gait Ambulation/Gait assistance: Min assist Gait Distance (Feet): 15 Feet(x2) Assistive device: Rolling walker (2 wheeled) Gait Pattern/deviations: Step-to pattern Gait velocity: decreased   General Gait Details: Pt maintaining NWB on L LE (reports fearful of TDWB); required cues for sequencing and RW proximity  Stairs            Wheelchair Mobility    Modified Rankin (Stroke Patients Only)       Balance Overall balance assessment: Needs assistance Sitting-balance support: Bilateral upper extremity supported;Feet supported Sitting balance-Leahy Scale: Fair     Standing balance support: Bilateral upper extremity supported;During functional activity Standing balance-Leahy Scale: Poor                               Pertinent Vitals/Pain Pain Assessment: Faces Faces Pain Scale: Hurts whole lot Pain Location: L hip Pain Descriptors / Indicators: Discomfort;Sharp;Burning;Grimacing;Guarding;Operative site guarding Pain Intervention(s): Limited activity within patient's tolerance;Patient requesting pain meds-RN notified;Monitored during session;Ice applied;Repositioned    Home Living Family/patient expects to be discharged to:: Private residence Living Arrangements: Spouse/significant other;Children(9 yo daughter will be home when spouse at work - she is able to care for self and could help with meals) Available Help at Discharge: Family;Available 24  hours/day Type of Home: House(split level) Home Access: Stairs to enter Entrance Stairs-Rails: None Entrance Stairs-Number of Steps: 2 Home Layout: Multi-level Home Equipment: Crutches      Prior Function Level of Independence: Independent         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  MMT L ankle and knee at least 3/5, hip 1/5    Cervical / Trunk Assessment Cervical / Trunk Assessment: Normal  Communication   Communication: No difficulties  Cognition Arousal/Alertness: Awake/alert Behavior During Therapy: WFL for tasks assessed/performed Overall Cognitive Status: Within Functional Limits for tasks assessed                                        General Comments General comments (skin integrity, edema, etc.): vss    Exercises     Assessment/Plan    PT Assessment Patient needs continued PT services  PT Problem List Decreased strength;Decreased mobility;Decreased safety awareness;Decreased range of motion;Decreased coordination;Decreased activity tolerance;Decreased balance;Decreased knowledge of use of DME;Pain       PT Treatment Interventions DME instruction;Therapeutic activities;Modalities;Gait training;Therapeutic exercise;Patient/family education;Stair training;Balance training;Functional mobility training    PT Goals (Current goals can be found in the Care Plan section)  Acute Rehab PT Goals Patient Stated Goal: return home; decrease pain PT Goal Formulation: With patient Time For Goal Achievement: 01/11/20 Potential to Achieve Goals: Good Additional Goals Additional Goal #1: L hip strength to 3/5 to assist with transfers    Frequency Min 6X/week   Barriers to discharge Inaccessible home environment      Co-evaluation               AM-PAC PT "6 Clicks" Mobility  Outcome Measure Help needed  turning from your back to your side while in a flat bed without using bedrails?: A Little Help needed moving from lying on your back to sitting on the side of a flat bed without using bedrails?: A Little Help needed moving to and from a bed to a chair (including a wheelchair)?: A Little Help needed standing up from a chair using your arms (e.g., wheelchair or bedside chair)?: A Little Help needed to walk in hospital room?: A Little Help needed climbing 3-5 steps with a railing? : A Lot 6 Click Score: 17    End of Session Equipment Utilized During Treatment: Gait belt Activity Tolerance: Patient tolerated treatment well Patient left: in bed;with call bell/phone within reach;with family/visitor present Nurse Communication: Mobility status PT Visit Diagnosis: Unsteadiness on feet (R26.81);Other abnormalities of gait and mobility (R26.89);Muscle weakness (generalized) (M62.81)    Time: 6387-5643 PT Time Calculation (min) (ACUTE ONLY): 38 min   Charges:   PT Evaluation $PT Eval Low Complexity: 1 Low PT Treatments $Gait Training: 8-22 mins        Megan Foley, PT Acute Rehab Services Pager [phone] [facility] [phone] [facility] [phone]   Karlton Lemon 12/28/2019, 5:38 PM

## 2019-12-28 NOTE — Interval H&P Note (Signed)
History and Physical Interval Note:  12/28/2019 9:22 AM  Megan Foley  has presented today for surgery, with the diagnosis of Left posterior wall acetabular fracture.  The various methods of treatment have been discussed with the patient and family. After consideration of risks, benefits and other options for treatment, the patient has consented to  Procedure(s): OPEN REDUCTION INTERNAL FIXATION ACETABULUM POSTERIOR LATERAL (Left) as a surgical intervention.  The patient's history has been reviewed, patient examined, no change in status, stable for surgery.  I have reviewed the patient's chart and labs.  Questions were answered to the patient's satisfaction.     Caryn Bee P Lakoda Raske

## 2019-12-28 NOTE — Anesthesia Procedure Notes (Signed)
Procedure Name: Intubation Date/Time: 12/28/2019 10:15 AM Performed by: Adria Dill, CRNA Pre-anesthesia Checklist: Patient identified, Emergency Drugs available, Suction available and Patient being monitored Patient Re-evaluated:Patient Re-evaluated prior to induction Oxygen Delivery Method: Circle system utilized Preoxygenation: Pre-oxygenation with 100% oxygen Induction Type: IV induction Ventilation: Mask ventilation without difficulty Laryngoscope Size: Miller and 2 Grade View: Grade I Tube type: Oral Tube size: 7.0 mm Number of attempts: 1 Airway Equipment and Method: Stylet and Oral airway Placement Confirmation: ETT inserted through vocal cords under direct vision,  positive ETCO2 and breath sounds checked- equal and bilateral Secured at: 21 cm Tube secured with: Tape Dental Injury: Teeth and Oropharynx as per pre-operative assessment

## 2019-12-29 ENCOUNTER — Encounter: Payer: Self-pay | Admitting: *Deleted

## 2019-12-29 DIAGNOSIS — S73015A Posterior dislocation of left hip, initial encounter: Secondary | ICD-10-CM | POA: Insufficient documentation

## 2019-12-29 LAB — CBC
HCT: 30.2 % — ABNORMAL LOW (ref 36.0–46.0)
Hemoglobin: 9.7 g/dL — ABNORMAL LOW (ref 12.0–15.0)
MCH: 30.2 pg (ref 26.0–34.0)
MCHC: 32.1 g/dL (ref 30.0–36.0)
MCV: 94.1 fL (ref 80.0–100.0)
Platelets: 237 10*3/uL (ref 150–400)
RBC: 3.21 MIL/uL — ABNORMAL LOW (ref 3.87–5.11)
RDW: 12.3 % (ref 11.5–15.5)
WBC: 11.1 10*3/uL — ABNORMAL HIGH (ref 4.0–10.5)
nRBC: 0 % (ref 0.0–0.2)

## 2019-12-29 NOTE — Progress Notes (Signed)
Orthopaedic Trauma Progress Note  S: Doing okay this morning.  Moderate amount of pain in the left hip.  Was able to get some sleep last night.  Able to get up to bedside commode with physical therapy yesterday afternoon.  No questions this morning.  O:  Vitals:   12/29/19 0500 12/29/19 0750  BP: 110/68 (!) 89/51  Pulse: 71 65  Resp: 20 17  Temp: 98.1 F (36.7 C) 98.2 F (36.8 C)  SpO2: 100% 99%    General - Laying in bed, no acute distress.  Awake alert and oriented Respiratory - No increased work of breathing.  Left lower extremity - Dressing over posterior lateral hip with scant drainage.  Tender with palpation of the hip.  Mild swelling noted to the knee.  Ankle dorsiflexion and plantarflexion is intact.  Sensation intact to light touch distally.  Compartments are soft and compressible.  Neurovascularly intact.  Imaging: Stable post op imaging.   Labs:  Results for orders placed or performed during the hospital encounter of 12/26/19 (from the past 24 hour(s))  CBC     Status: Abnormal   Collection Time: 12/29/19  2:01 AM  Result Value Ref Range   WBC 11.1 (H) 4.0 - 10.5 K/uL   RBC 3.21 (L) 3.87 - 5.11 MIL/uL   Hemoglobin 9.7 (L) 12.0 - 15.0 g/dL   HCT 47.6 (L) 54.6 - 50.3 %   MCV 94.1 80.0 - 100.0 fL   MCH 30.2 26.0 - 34.0 pg   MCHC 32.1 30.0 - 36.0 g/dL   RDW 54.6 56.8 - 12.7 %   Platelets 237 150 - 400 K/uL   nRBC 0.0 0.0 - 0.2 %    Assessment: 39 year old female status post fall, 1 Day Post-Op   Injuries: Left posterior wall acetabular fracture-dislocation s/p ORIF  Weightbearing: Touchdown weightbearing left lower extremity  Insicional and dressing care: Plan to change dressing tomorrow  Orthopedic device(s): None  CV/Blood loss: Hemoglobin 9.7 this morning.  Hemodynamically stable  Pain management:  1. Tylenol 1000 mg q 6 hours scheduled 2. Robaxin 500 mg q 6 hours PRN 3. Oxycodone 5-15 mg q 4 hours PRN 4. Dilaudid 1 mg q 3 hours PRN 5. Toradol 15 mg q 6  hours x5 doses 6.  Morphine 2 mg q 4 hours PRN  ID: Ancef 2 gm postop  VTE prophylaxis: Lovenox starting today.  We will continue this x30 days at discharge SCDs: In place bilateral lower extremities  Foley/Lines:  No foley, KVO IVFs  Medical co-morbidities: None  Impediments to Fracture Healing: Vitamin D is 38.  Although in normal range, we will plan to start 2000 units vitamin D3 daily.  Dispo: PT/OT evaluation, recommending home health therapies.  DME has been ordered.  Work on pain control today.  Hopefully discharge in the next 48 to 72 hours.  Follow - up plan: 2 weeks for repeat x-rays  Contact information:  Truitt Merle MD, [place]ward PA-C   Jari Carollo A. Ladonna Snide [facility] [phone] (office) [email or web address]

## 2019-12-29 NOTE — Plan of Care (Signed)
  Problem: Education: Goal: Knowledge of General Education information will improve Description Including pain rating scale, medication(s)/side effects and non-pharmacologic comfort measures Outcome: Adequate for Discharge   Problem: Health Behavior/Discharge Planning: Goal: Ability to manage health-related needs will improve Outcome: Adequate for Discharge   

## 2019-12-29 NOTE — Progress Notes (Signed)
Physical Therapy Treatment Patient Details Name: Megan Foley MRN: [id number] DOB: 12-07-1980 Today's Date: 12/29/2019    History of Present Illness Pt is 39 yo female who tripped over playground equipment at work and fell.  She sustained L posterior wall acetabular fracture/dislocation and is s/p closed reduction of hip dislocation and placement of traction pin on 2/8, and now ORIF of acetabular fx and removal of traction pin on 2/10.  Pt with TDWB status.    PT Comments    Pt is progressing well towards goals. She progressed into hallway for ambulation and increased ambulation distance. Pt also performed seated HEP. Pt reports d/c possible tomorrow or Saturday. Plan to practice stair negotiation next session.    Follow Up Recommendations  Home health PT;Supervision/Assistance - 24 hour     Equipment Recommendations  Rolling walker with 5" wheels;3in1 (PT)(tub bench; possible cane (stairs only have 1 rail))    Recommendations for Other Services       Precautions / Restrictions Precautions Precautions: Fall Restrictions Weight Bearing Restrictions: Yes LLE Weight Bearing: Touchdown weight bearing    Mobility  Bed Mobility               General bed mobility comments: in chair on arrival  Transfers Overall transfer level: Needs assistance Equipment used: Rolling walker (2 wheeled) Transfers: Sit to/from Stand Sit to Stand: Min guard         General transfer comment: min guard for rise from recliner chair and elevated toilet seat with use of arm rests  Ambulation/Gait Ambulation/Gait assistance: Min assist Gait Distance (Feet): 120 Feet Assistive device: Rolling walker (2 wheeled) Gait Pattern/deviations: Step-to pattern(hop to) Gait velocity: decreased   General Gait Details: Pt maintaining NWB on L LE (reports fearful of TDWB); hop to pattern. Slow and guarded gait, but overall steady with RW.   Stairs             Wheelchair Mobility     Modified Rankin (Stroke Patients Only)       Balance Overall balance assessment: Needs assistance Sitting-balance support: Bilateral upper extremity supported;Feet supported Sitting balance-Leahy Scale: Fair     Standing balance support: Bilateral upper extremity supported;During functional activity Standing balance-Leahy Scale: Poor                              Cognition Arousal/Alertness: Awake/alert Behavior During Therapy: WFL for tasks assessed/performed Overall Cognitive Status: Within Functional Limits for tasks assessed                                        Exercises General Exercises - Lower Extremity Ankle Circles/Pumps: AROM;Both;10 reps;Seated Long Arc Quad: AROM;Left;10 reps;Seated Heel Slides: AROM;Left;10 reps;Seated    General Comments General comments (skin integrity, edema, etc.): able to perform peri care w/o assist      Pertinent Vitals/Pain Pain Assessment: Faces Faces Pain Scale: Hurts even more Pain Location: L hip Pain Descriptors / Indicators: Discomfort;Sharp;Burning;Grimacing;Guarding;Operative site guarding Pain Intervention(s): Monitored during session;Limited activity within patient's tolerance    Home Living                      Prior Function            PT Goals (current goals can now be found in the care plan section) Acute Rehab PT Goals Patient Stated Goal:  return home; decrease pain PT Goal Formulation: With patient Time For Goal Achievement: 01/11/20 Potential to Achieve Goals: Good Progress towards PT goals: Progressing toward goals    Frequency    Min 6X/week      PT Plan Current plan remains appropriate    Co-evaluation              AM-PAC PT "6 Clicks" Mobility   Outcome Measure  Help needed turning from your back to your side while in a flat bed without using bedrails?: A Little Help needed moving from lying on your back to sitting on the side of a flat bed  without using bedrails?: A Little Help needed moving to and from a bed to a chair (including a wheelchair)?: A Little Help needed standing up from a chair using your arms (e.g., wheelchair or bedside chair)?: A Little Help needed to walk in hospital room?: A Little Help needed climbing 3-5 steps with a railing? : A Lot 6 Click Score: 17    End of Session Equipment Utilized During Treatment: Gait belt Activity Tolerance: Patient tolerated treatment well Patient left: with call bell/phone within reach;in chair Nurse Communication: Mobility status PT Visit Diagnosis: Unsteadiness on feet (R26.81);Other abnormalities of gait and mobility (R26.89);Muscle weakness (generalized) (M62.81)     Time: 6629-4765 PT Time Calculation (min) (ACUTE ONLY): 36 min  Charges:  $Gait Training: 23-37 mins                     Benjiman Core, [place] Pager [phone] Acute Rehab   Allena Katz 12/29/2019, 2:02 PM

## 2019-12-29 NOTE — Evaluation (Signed)
Occupational Therapy Evaluation Patient Details Name: Megan Foley MRN: [id number] DOB: February 12, 1981 Today's Date: 12/29/2019    History of Present Illness Pt is 39 yo female who tripped over playground equipment at work and fell.  She sustained L posterior wall acetabular fracture/dislocation and is s/p closed reduction of hip dislocation and placement of traction pin on 2/8, and now ORIF of acetabular fx and removal of traction pin on 2/10.  Pt with TDWB status.   Clinical Impression   Pt admitted with the above diagnoses and presents with below problem list. Pt will benefit from continued acute OT to address the below listed deficits and maximize independence with basic ADLs prior to d/c home. At baseline, pt is independent with ADLs, drives, works at a preschool. Pt currently setup to min A with basic ADLs. Pain a limiting factor though pt was motivated to complete all tasks presented this session. Discussed home setup and assist level. Pt's spouse goes into work around noon, her 9yo daughter is with her all the time and able to provide some assistance (donning shoes, bringing items, simple meal setup) per pt report. Pt reluctant with TDWB and tends to keep LLE in NWB position.      Follow Up Recommendations  Home health OT;Supervision - Intermittent    Equipment Recommendations  Tub/shower bench;3 in 1 bedside commode    Recommendations for Other Services       Precautions / Restrictions Precautions Precautions: Fall Restrictions Weight Bearing Restrictions: Yes LLE Weight Bearing: Non weight bearing      Mobility Bed Mobility Overal bed mobility: Needs Assistance Bed Mobility: Supine to Sit     Supine to sit: Min assist;HOB elevated     General bed mobility comments: assist to unweight L heel. Extra time and effort. Increased pain with transistional movements  Transfers Overall transfer level: Needs assistance Equipment used: Rolling walker (2 wheeled) Transfers: Sit  to/from Stand Sit to Stand: Min guard;Min assist         General transfer comment: min steadying assist on initial stand improving to min guard. to/from EOB, BSC, and recliner. cues for technique with rw.    Balance Overall balance assessment: Needs assistance Sitting-balance support: Bilateral upper extremity supported;Feet supported Sitting balance-Leahy Scale: Fair     Standing balance support: Bilateral upper extremity supported;During functional activity Standing balance-Leahy Scale: Poor                             ADL either performed or assessed with clinical judgement   ADL Overall ADL's : Needs assistance/impaired Eating/Feeding: Set up;Sitting   Grooming: Set up;Sitting   Upper Body Bathing: Minimal assistance;Set up;Sitting   Lower Body Bathing: Sit to/from stand;Minimal assistance   Upper Body Dressing : Set up;Minimal assistance;Sitting   Lower Body Dressing: Minimal assistance;Sit to/from stand   Toilet Transfer: Min guard;Ambulation;RW;Minimal assistance Toilet Transfer Details (indicate cue type and reason): 3n1 over toilet. steadying assist. extra time and effort. Increased pain with transitional movements Toileting- Clothing Manipulation and Hygiene: Set up;Min guard;Sitting/lateral lean;Sit to/from stand Toileting - Clothing Manipulation Details (indicate cue type and reason): completed in sitting position with setup Tub/ Shower Transfer: Tub transfer;Minimal assistance;Ambulation;Rolling walker;Tub bench;Min guard   Functional mobility during ADLs: Min guard;Rolling walker General ADL Comments: Walked in room navigating turns and obstacles, toilet transfer and pericare then 1 grooming task standing at sink. Increased pain with OOB activity but pt motivated to complete as independently as possible. Fair  sitting balance due to pain, tends to sit in BUE support position for comfort     Vision         Perception     Praxis       Pertinent Vitals/Pain Pain Assessment: Faces Faces Pain Scale: Hurts whole lot Pain Location: L hip Pain Descriptors / Indicators: Discomfort;Sharp;Burning;Grimacing;Guarding;Operative site guarding Pain Intervention(s): Premedicated before session;Monitored during session;Limited activity within patient's tolerance;Repositioned;Ice applied     Hand Dominance     Extremity/Trunk Assessment Upper Extremity Assessment Upper Extremity Assessment: Overall WFL for tasks assessed   Lower Extremity Assessment Lower Extremity Assessment: Defer to PT evaluation   Cervical / Trunk Assessment Cervical / Trunk Assessment: Normal   Communication Communication Communication: No difficulties   Cognition Arousal/Alertness: Awake/alert Behavior During Therapy: WFL for tasks assessed/performed Overall Cognitive Status: Within Functional Limits for tasks assessed                                     General Comments       Exercises     Shoulder Instructions      Home Living Family/patient expects to be discharged to:: Private residence Living Arrangements: Spouse/significant other;Children( 65 yo daughter will be home when spouse at work - she is abl) Available Help at Discharge: Family;Available 24 hours/day Type of Home: House(split level) Home Access: Stairs to enter Entrance Stairs-Number of Steps: 2 Entrance Stairs-Rails: None Home Layout: Multi-level Alternate Level Stairs-Number of Steps: Pt lives in split level : has 7 steps with rail on R to get to bedroom and bathroom where she could stay.   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        OT Problem List: Impaired balance (sitting and/or standing);Decreased knowledge of use of DME or AE;Decreased knowledge of  precautions;Pain      OT Treatment/Interventions: Self-care/ADL training;DME and/or AE instruction;Therapeutic activities;Patient/family education;Balance training    OT Goals(Current goals can be found in the care plan section) Acute Rehab OT Goals Patient Stated Goal: return home; decrease pain OT Goal Formulation: With patient Time For Goal Achievement: 01/12/20 Potential to Achieve Goals: Good ADL Goals Pt Will Perform Grooming: with modified independence;standing Pt Will Perform Upper Body Dressing: with set-up;sitting Pt Will Perform Lower Body Dressing: with supervision;sit to/from stand Pt Will Transfer to Toilet: with supervision;ambulating Pt Will Perform Toileting - Clothing Manipulation and hygiene: with supervision;sit to/from stand Pt Will Perform Tub/Shower Transfer: Tub transfer;with supervision;ambulating;tub bench;rolling walker  OT Frequency: Min 2X/week   Barriers to D/C:            Co-evaluation              AM-PAC OT "6 Clicks" Daily Activity     Outcome Measure Help from another person eating meals?: None Help from another person taking care of personal grooming?: None Help from another person toileting, which includes using toliet, bedpan, or urinal?: A Little Help from another person bathing (including washing, rinsing, drying)?: A Little Help from another person to put on and taking off regular upper body clothing?: A Little Help from another person to put on and taking off regular lower body clothing?: A Little 6 Click Score: 20  End of Session Equipment Utilized During Treatment: Engineer, water Communication: Mobility status;Other (comment)(increased pain with OOB activity)  Activity Tolerance: Patient limited by pain Patient left: in chair;with call bell/phone within reach  OT Visit Diagnosis: Unsteadiness on feet (R26.81);Pain Pain - Right/Left: Left Pain - part of body: Leg                Time: 3016-0109 OT Time Calculation  (min): 36 min Charges:  OT General Charges $OT Visit: 1 Visit OT Evaluation $OT Eval Low Complexity: 1 Low OT Treatments $Self Care/Home Management : 8-22 mins  Raynald Kemp, OT Acute Rehabilitation Services Pager: [phone] Office: [phone]   Pilar Grammes 12/29/2019, 11:15 AM

## 2019-12-29 NOTE — TOC Initial Note (Addendum)
Transition of Care [facility]) - Initial/Assessment Note    Patient Details  Name: Megan Foley MRN: [id number] Date of Birth: 06/03/1981  Transition of [facility]) CM/SW Contact:    Kingsley Plan, RN Phone Number: 12/29/2019, 11:45 AM  Clinical Narrative:                 Spoke to patient and husband Megan Foley at bedside. Confirmed face sheet information.   Patient's Workers Comp Sports coach is Megan Foley [phone], patient gave permission for NCM to call and fax information to Ms Enid Derry.   Called and left a message , awaiting call back.   Explained to patient Workers Comp usually arranges HHPT, tub bench , 3 in 1 and walker with agencies they are contracted with. Will update patient once Megan Foley with [employer] returns call.  Megan Foley returned call, she is requesting orders for HHPT and DME be faxed to her at [phone]. She will email Zack with [employer] authorization, she will arrange HHPT and let patient know the agency name. She has emailed patient form that she can take to her pharmacy at discharge to get prescriptions filled.   Expected Discharge Plan: Home w Home Health Services Barriers to Discharge: Continued Medical Work up   Patient Goals and CMS Choice Patient states their goals for this hospitalization and ongoing recovery are:: to return to home CMS Medicare.gov Compare Post Acute Care list provided to:: Patient Choice offered to / list presented to : NA  Expected Discharge Plan and Services Expected Discharge Plan: Home w Home Health Services   Discharge Planning Services: CM Consult   Living arrangements for the past 2 months: Single Family Home                 DME Arranged: 3-N-1, Tub bench, Walker rolling         HH Arranged: PT          Prior Living Arrangements/Services Living arrangements for the past 2 months: Single Family Home Lives with:: Spouse Patient language and need for interpreter reviewed:: Yes Do you  feel safe going back to the place where you live?: Yes      Need for Family Participation in Patient Care: Yes (Comment) Care giver support system in place?: Yes (comment)   Criminal Activity/Legal Involvement Pertinent to Current Situation/Hospitalization: No - Comment as needed  Activities of Daily Living Home Assistive Devices/Equipment: None ADL Screening (condition at time of admission) Patient's cognitive ability adequate to safely complete daily activities?: Yes Is the patient deaf or have difficulty hearing?: No Does the patient have difficulty seeing, even when wearing glasses/contacts?: No Does the patient have difficulty concentrating, remembering, or making decisions?: No Patient able to express need for assistance with ADLs?: Yes Does the patient have difficulty dressing or bathing?: Yes Independently performs ADLs?: No Does the patient have difficulty walking or climbing stairs?: Yes Weakness of Legs: Left Weakness of Arms/Hands: None  Permission Sought/Granted Permission sought to share information with : Case Manager Permission granted to share information with : Yes, Verbal Permission Granted  Share Information with NAME: husband Megan Foley [phone], Workers Comp Case manager Megan Foley [phone]           Emotional Assessment Appearance:: Appears stated age Attitude/Demeanor/Rapport: Engaged Affect (typically observed): Accepting Orientation: : Oriented to Self, Oriented to Place, Oriented to  Time, Oriented to Situation Alcohol / Substance Use: Not Applicable Psych Involvement: No (comment)  Admission diagnosis:  Fall [W19.XXXA] Left acetabular fracture ([place]) [S32.402A] Closed dislocation of left hip, initial encounter [facility]) [Z00.923R] Patient Active Problem List   Diagnosis Date Noted  . Closed posterior dislocation of left hip ([place]) 12/29/2019  . Left acetabular fracture ([place]) 12/26/2019   PCP:  Patient, No Pcp Per Pharmacy:   [facility]  [address] ([address] [place] [address] [place] ([place]) [place] [address] Phone: [phone] Fax: [phone]     Social Determinants of Health (SDOH) Interventions    Readmission Risk Interventions No flowsheet data found.

## 2019-12-30 LAB — CBC
HCT: 26.9 % — ABNORMAL LOW (ref 36.0–46.0)
Hemoglobin: 8.6 g/dL — ABNORMAL LOW (ref 12.0–15.0)
MCH: 30.1 pg (ref 26.0–34.0)
MCHC: 32 g/dL (ref 30.0–36.0)
MCV: 94.1 fL (ref 80.0–100.0)
Platelets: 200 10*3/uL (ref 150–400)
RBC: 2.86 MIL/uL — ABNORMAL LOW (ref 3.87–5.11)
RDW: 12.3 % (ref 11.5–15.5)
WBC: 6.7 10*3/uL (ref 4.0–10.5)
nRBC: 0 % (ref 0.0–0.2)

## 2019-12-30 MED ORDER — METHOCARBAMOL 500 MG PO TABS: 500.0000 mg | ORAL_TABLET | Freq: Four times a day (QID) | ORAL | 0 refills | Status: AC | PRN

## 2019-12-30 MED ORDER — DM-GUAIFENESIN ER 30-600 MG PO TB12
1.0000 | ORAL_TABLET | Freq: Two times a day (BID) | ORAL | Status: DC
  Administered 2019-12-30: 10:00:00 1 via ORAL
  Filled 2019-12-30: qty 1

## 2019-12-30 MED ORDER — OXYCODONE-ACETAMINOPHEN 7.5-325 MG PO TABS: 1.0000 | ORAL_TABLET | ORAL | 0 refills | Status: AC | PRN

## 2019-12-30 MED ORDER — ACETAMINOPHEN 500 MG PO TABS: 1000.0000 mg | ORAL_TABLET | Freq: Two times a day (BID) | ORAL | 0 refills | Status: DC

## 2019-12-30 MED ORDER — ENOXAPARIN SODIUM 40 MG/0.4ML ~~LOC~~ SOLN: 40.0000 mg | SUBCUTANEOUS | 0 refills | Status: AC

## 2019-12-30 MED ORDER — VITAMIN D3 50 MCG (2000 UT) PO CAPS: 2000.0000 [IU] | ORAL_CAPSULE | Freq: Every day | ORAL | 0 refills | Status: AC

## 2019-12-30 MED ORDER — ACETAMINOPHEN 325 MG PO TABS: 650.0000 mg | ORAL_TABLET | Freq: Four times a day (QID) | ORAL | Status: AC | PRN

## 2019-12-30 MED ORDER — GABAPENTIN 100 MG PO CAPS: 100.0000 mg | ORAL_CAPSULE | Freq: Three times a day (TID) | ORAL | 0 refills | Status: AC

## 2019-12-30 NOTE — Progress Notes (Signed)
Occupational Therapy Treatment Patient Details Name: Megan Foley MRN: [id number] DOB: 27-Dec-1980 Today's Date: 12/30/2019    History of present illness Pt is 39 yo female who tripped over playground equipment at work and fell.  She sustained L posterior wall acetabular fracture/dislocation and is s/p closed reduction of hip dislocation and placement of traction pin on 2/8, and now ORIF of acetabular fx and removal of traction pin on 2/10.  Pt with TDWB status.   OT comments  Pt. Seen for skilled OT treatment session.  Focus of session was bed mobility, ambulation to b.room for toileting task and review of tub transfer with use of shower chair.  Note d/c home with husband and dtr. Assisting as needed.    Follow Up Recommendations  Home health OT;Supervision - Intermittent    Equipment Recommendations  Tub/shower bench;3 in 1 bedside commode    Recommendations for Other Services      Precautions / Restrictions Precautions Precautions: Fall       Mobility Bed Mobility Overal bed mobility: Needs Assistance Bed Mobility: Supine to Sit           General bed mobility comments: hob flat to simulate home env. pt. able to prop up on b ues into long sitting then guide b les towards eob with min/mod a.  pt. assisting with b ues to turn body and sit eob  Transfers Overall transfer level: Needs assistance Equipment used: Rolling walker (2 wheeled) Transfers: Sit to/from [facility] Sit to Stand: Min guard Stand pivot transfers: Min guard            Balance                                           ADL either performed or assessed with clinical judgement   ADL Overall ADL's : Needs assistance/impaired                         Toilet Transfer: Min guard;Ambulation;RW;Minimal assistance Toilet Transfer Details (indicate cue type and reason): 3n1 over toilet. steadying assist. extra time and effort. Increased pain with transitional  movements Toileting- Clothing Manipulation and Hygiene: Set up;Min guard;Sitting/lateral lean     Tub/Shower Transfer Details (indicate cue type and reason): pt. has ordered tub chair-demo, education and review provided for tub transfer to L faucet.  reviewed backing up to tub, sitting on seat sideways then turning and pivoting R leg into tub and resting L leg on the side of the tub Functional mobility during ADLs: Min guard;Rolling walker General ADL Comments: pt. reports increased pain and fatigue today.  amb. to b.room for toileting then provided recliner in b.room to transport her out of the b.room (stair training later today with PT).     Vision       Perception     Praxis      Cognition Arousal/Alertness: Awake/alert Behavior During Therapy: WFL for tasks assessed/performed Overall Cognitive Status: Within Functional Limits for tasks assessed                                          Exercises     Shoulder Instructions       General Comments      Pertinent Vitals/ Pain  Pain Assessment: Faces Faces Pain Scale: Hurts whole lot Pain Location: L hip Pain Descriptors / Indicators: Discomfort;Sharp;Burning;Grimacing;Guarding;Operative site guarding Pain Intervention(s): Patient requesting pain meds-RN notified;Monitored during session;Limited activity within patient's tolerance;Repositioned  Home Living                                          Prior Functioning/Environment              Frequency  Min 2X/week        Progress Toward Goals  OT Goals(current goals can now be found in the care plan section)  Progress towards OT goals: Progressing toward goals     Plan      Co-evaluation                 AM-PAC OT "6 Clicks" Daily Activity     Outcome Measure   Help from another person eating meals?: None Help from another person taking care of personal grooming?: None Help from another person toileting,  which includes using toliet, bedpan, or urinal?: A Little Help from another person bathing (including washing, rinsing, drying)?: A Little Help from another person to put on and taking off regular upper body clothing?: A Little Help from another person to put on and taking off regular lower body clothing?: A Little 6 Click Score: 20    End of Session Equipment Utilized During Treatment: Rolling walker;Gait belt  OT Visit Diagnosis: Unsteadiness on feet (R26.81);Pain Pain - Right/Left: Left Pain - part of body: Leg   Activity Tolerance Patient limited by pain   Patient Left in chair;with call bell/phone within reach   Nurse Communication          Time: 6415-8309 OT Time Calculation (min): 22 min  Charges: OT General Charges $OT Visit: 1 Visit OT Treatments $Self Care/Home Management : 8-22 mins  Boneta Lucks, COTA/L Acute Rehabilitation [phone]   Robet Leu 12/30/2019, 12:43 PM

## 2019-12-30 NOTE — Care Management (Signed)
Workers Comp Case Child psychotherapist aware discharge is today. DME was delivered to patient's room yesterday. Anissa will arrange HHPT to start Monday 01/02/20.   Faxed DC summary To [facility].   Ronny Flurry RN

## 2019-12-30 NOTE — Progress Notes (Addendum)
Orthopaedic Trauma Progress Note  S: Pain better controlled this morning. Feels like she has some nasal congestion and some mucus in the back of her throat that she can't clear.  Will try her on some mucinex. Got up several times with therapies and nursing yesterday, tolerated this well. Plans to work on navigating stair today with PT.   O:  Vitals:   12/29/19 1938 12/30/19 0537  BP: (!) 102/54 101/63  Pulse: 72 82  Resp: 18 14  Temp: 98.2 F (36.8 C) 98.9 F (37.2 C)  SpO2: 100% 97%    General - Laying in bed, no acute distress.  Awake alert and oriented Respiratory - No increased work of breathing.  Left lower extremity - Dressing over posterior lateral hip removed, incision clean, dry, intact with steri-strips in place. No active drainage.  Tender with palpation of the hip.  Mild swelling noted to the knee.  Ankle dorsiflexion and plantarflexion is intact.  Sensation intact to light touch distally.  Compartments are soft and compressible.  Neurovascularly intact.  Imaging: Stable post op imaging.   Labs:  Results for orders placed or performed during the hospital encounter of 12/26/19 (from the past 24 hour(s))  CBC     Status: Abnormal   Collection Time: 12/30/19  5:32 AM  Result Value Ref Range   WBC 6.7 4.0 - 10.5 K/uL   RBC 2.86 (L) 3.87 - 5.11 MIL/uL   Hemoglobin 8.6 (L) 12.0 - 15.0 g/dL   HCT 39.7 (L) 67.3 - 41.9 %   MCV 94.1 80.0 - 100.0 fL   MCH 30.1 26.0 - 34.0 pg   MCHC 32.0 30.0 - 36.0 g/dL   RDW 37.9 02.4 - 09.7 %   Platelets 200 150 - 400 K/uL   nRBC 0.0 0.0 - 0.2 %    Assessment: 39 year old female status post fall, 2 Days Post-Op   Injuries: Left posterior wall acetabular fracture-dislocation s/p ORIF  Weightbearing: Touchdown weightbearing left lower extremity  Insicional and dressing care: Okay to leave open to air if no drainage, apply mepilex PRN  Orthopedic device(s): None CV/Blood loss: Hemoglobin 8.6 this morning.  Hemodynamically stable  Pain  management:  1. Tylenol 1000 mg q 6 hours scheduled 2. Robaxin 500 mg q 6 hours PRN 3. Oxycodone 5-15 mg q 4 hours PRN 4. Dilaudid 1 mg q 3 hours PRN 5. Toradol 15 mg q 6 hours x5 doses 6.  Morphine 2 mg q 4 hours PRN  ID: Ancef 2 gm postop completed  VTE prophylaxis: Lovenox.  We will continue this x30 days at discharge SCDs: In place bilateral lower extremities  Foley/Lines:  No foley, KVO IVFs  Medical co-morbidities: None  Impediments to Fracture Healing: Vitamin D is 38.  Although in normal range, we will plan to start 2000 units vitamin D3 daily.  Dispo: PT/OT evaluation, recommending home health therapies.  DME has been ordered.  Work on pain control today.  Hopefully discharge in the next 48 to 72 hours.  Follow - up plan: 2 weeks for repeat x-rays  Contact information:  Truitt Merle MD, Ulyses Southward PA-C   Brittan Mapel A. Ladonna Snide [facility] [phone] (office) [email or web address]

## 2019-12-30 NOTE — Discharge Summary (Signed)
Orthopaedic Trauma Service (OTS) Discharge Summary   Patient ID: Megan Foley MRN: [id number] DOB/AGE: 1981-09-10 39 y.o.  Admit date: 12/26/2019 Discharge date: 12/30/2019  Admission Diagnoses: Left posterior wall acetabular fracture dislocation  Discharge Diagnoses:  Principal Problem:   Left acetabular fracture [facility]) Active Problems:   Closed posterior dislocation of left hip [facility])   Past Medical History:  Diagnosis Date  . ADHD   . Anxiety   . Asthma   . Depression      Procedures Performed: 1. CPT 27252-Closed reduction of left hip dislocation 2. CPT 20650-Distal femoral traction pin placement 3. CPT 27254-Open reduction internal fixation of left posterior wall acetabular fracture 4. CPT 20670-Removal of traction pin  Discharged Condition: good  Hospital Course: Patient presented to [facility] emergency department on 12/26/2019 after sustaining a fall while at work.  Was found to have a left posterior posterior wall acetabulum fracture with associated posterior hip dislocation.  Orthopedics was consulted.  Patient taken urgently for closed reduction of the hip and placement of a distal femoral traction pin in the operating room by Dr. Jena Gauss.  Patient tolerated this procedure well.  Was placed on bedrest and admitted to the orthopedic trauma service.  Patient taken back to the operating room by Dr. Jena Gauss on 12/28/2019 for removal of traction pin and ORIF of the left posterior wall acetabular fracture.  She tolerated this procedure well without complications.  Was instructed to be touchdown weightbearing on the left lower extremity postoperatively.  Began working with physical and occupational therapy starting on postoperative day #1.  Was started on Lovenox for DVT prophylaxis starting on postoperative day #1.  The remainder of the patient's hospitalization was dedicated to pain control and increasing mobility. On 12/30/2019, the patient was tolerating diet, working well  with therapies, pain well controlled, vital signs stable, dressings clean, dry, intact and felt stable for discharge to home. Patient will follow up as below and knows to call with questions or concerns.     Consults: None  Significant Diagnostic Studies:   Results for orders placed or performed during the hospital encounter of 12/26/19 (from the past 168 hour(s))  CBC with Differential   Collection Time: 12/26/19  1:15 PM  Result Value Ref Range   WBC 12.8 (H) 4.0 - 10.5 K/uL   RBC 3.61 (L) 3.87 - 5.11 MIL/uL   Hemoglobin 11.1 (L) 12.0 - 15.0 g/dL   HCT 76.5 (L) 46.5 - 03.5 %   MCV 93.4 80.0 - 100.0 fL   MCH 30.7 26.0 - 34.0 pg   MCHC 32.9 30.0 - 36.0 g/dL   RDW 46.5 68.1 - 27.5 %   Platelets 255 150 - 400 K/uL   nRBC 0.0 0.0 - 0.2 %   Neutrophils Relative % 80 %   Neutro Abs 10.5 (H) 1.7 - 7.7 K/uL   Lymphocytes Relative 14 %   Lymphs Abs 1.8 0.7 - 4.0 K/uL   Monocytes Relative 4 %   Monocytes Absolute 0.5 0.1 - 1.0 K/uL   Eosinophils Relative 1 %   Eosinophils Absolute 0.1 0.0 - 0.5 K/uL   Basophils Relative 0 %   Basophils Absolute 0.0 0.0 - 0.1 K/uL   Immature Granulocytes 1 %   Abs Immature Granulocytes 0.07 0.00 - 0.07 K/uL  Basic metabolic panel   Collection Time: 12/26/19  1:15 PM  Result Value Ref Range   Sodium 138 135 - 145 mmol/L   Potassium 3.4 (L) 3.5 - 5.1 mmol/L  Chloride 103 98 - 111 mmol/L   CO2 23 22 - 32 mmol/L   Glucose, Bld 106 (H) 70 - 99 mg/dL   BUN 11 6 - 20 mg/dL   Creatinine, Ser 0.63 0.44 - 1.00 mg/dL   Calcium 9.0 8.9 - 10.3 mg/dL   GFR calc non Af Amer >60 >60 mL/min   GFR calc Af Amer >60 >60 mL/min   Anion gap 12 5 - 15  I-Stat beta hCG blood, ED   Collection Time: 12/26/19  1:18 PM  Result Value Ref Range   I-stat hCG, quantitative <5.0 <5 mIU/mL   Comment 3          Respiratory Panel by RT PCR (Flu A&B, Covid) - Nasopharyngeal Swab   Collection Time: 12/26/19  3:15 PM   Specimen: Nasopharyngeal Swab  Result Value Ref Range    SARS Coronavirus 2 by RT PCR NEGATIVE NEGATIVE   Influenza A by PCR NEGATIVE NEGATIVE   Influenza B by PCR NEGATIVE NEGATIVE  HIV Antibody (routine testing w rflx)   Collection Time: 12/26/19  7:02 PM  Result Value Ref Range   HIV Screen 4th Generation wRfx NON REACTIVE NON REACTIVE  VITAMIN D 25 Hydroxy (Vit-D Deficiency, Fractures)   Collection Time: 12/27/19  3:04 AM  Result Value Ref Range   Vit D, 25-Hydroxy 38.39 30 - 100 ng/mL  CBC   Collection Time: 12/27/19  8:42 AM  Result Value Ref Range   WBC 13.0 (H) 4.0 - 10.5 K/uL   RBC 3.55 (L) 3.87 - 5.11 MIL/uL   Hemoglobin 10.7 (L) 12.0 - 15.0 g/dL   HCT 33.2 (L) 36.0 - 46.0 %   MCV 93.5 80.0 - 100.0 fL   MCH 30.1 26.0 - 34.0 pg   MCHC 32.2 30.0 - 36.0 g/dL   RDW 11.9 11.5 - 15.5 %   Platelets 266 150 - 400 K/uL   nRBC 0.0 0.0 - 0.2 %  CBC   Collection Time: 12/28/19  3:43 AM  Result Value Ref Range   WBC 8.6 4.0 - 10.5 K/uL   RBC 3.24 (L) 3.87 - 5.11 MIL/uL   Hemoglobin 9.8 (L) 12.0 - 15.0 g/dL   HCT 30.7 (L) 36.0 - 46.0 %   MCV 94.8 80.0 - 100.0 fL   MCH 30.2 26.0 - 34.0 pg   MCHC 31.9 30.0 - 36.0 g/dL   RDW 12.3 11.5 - 15.5 %   Platelets 218 150 - 400 K/uL   nRBC 0.0 0.0 - 0.2 %  Surgical pcr screen   Collection Time: 12/28/19  8:47 AM   Specimen: Nasal Mucosa; Nasal Swab  Result Value Ref Range   MRSA, PCR NEGATIVE NEGATIVE   Staphylococcus aureus POSITIVE (A) NEGATIVE  CBC   Collection Time: 12/29/19  2:01 AM  Result Value Ref Range   WBC 11.1 (H) 4.0 - 10.5 K/uL   RBC 3.21 (L) 3.87 - 5.11 MIL/uL   Hemoglobin 9.7 (L) 12.0 - 15.0 g/dL   HCT 30.2 (L) 36.0 - 46.0 %   MCV 94.1 80.0 - 100.0 fL   MCH 30.2 26.0 - 34.0 pg   MCHC 32.1 30.0 - 36.0 g/dL   RDW 12.3 11.5 - 15.5 %   Platelets 237 150 - 400 K/uL   nRBC 0.0 0.0 - 0.2 %  CBC   Collection Time: 12/30/19  5:32 AM  Result Value Ref Range   WBC 6.7 4.0 - 10.5 K/uL   RBC 2.86 (L) 3.87 - 5.11 MIL/uL  Hemoglobin 8.6 (L) 12.0 - 15.0 g/dL   HCT 89.2 (L)  11.9 - 46.0 %   MCV 94.1 80.0 - 100.0 fL   MCH 30.1 26.0 - 34.0 pg   MCHC 32.0 30.0 - 36.0 g/dL   RDW 41.7 40.8 - 14.4 %   Platelets 200 150 - 400 K/uL   nRBC 0.0 0.0 - 0.2 %     Treatments: surgery: 1. CPT 27252-Closed reduction of left hip dislocation 2. CPT 20650-Distal femoral traction pin placement 3. CPT 27254-Open reduction internal fixation of left posterior wall acetabular fracture 4. CPT 20670-Removal of traction pin  Discharge Exam: General - Laying in bed, no acute distress.  Awake alert and oriented Respiratory - No increased work of breathing.  Left lower extremity - Dressing over posterior lateral hip removed, incision clean, dry, intact with steri-strips in place. No active drainage.  Tender with palpation of the hip.  Mild swelling noted to the knee.  Ankle dorsiflexion and plantarflexion is intact.  Sensation intact to light touch distally. Compartments are soft and compressible.  Neurovascularly intact.  Disposition: Discharge disposition: 06-Home-Health Care Svc        Allergies as of 12/30/2019      Reactions   Mushroom Extract Complex Anaphylaxis, Anxiety, Swelling, Other (See Comments), Itching, Nausea And Vomiting, Shortness Of Breath, Hives      Medication List    TAKE these medications   acetaminophen 325 MG tablet Commonly known as: TYLENOL Take 2 tablets (650 mg total) by mouth every 6 (six) hours as needed for mild pain or moderate pain. What changed:   medication strength  how much to take  reasons to take this   albuterol 108 (90 Base) MCG/ACT inhaler Commonly known as: VENTOLIN HFA Inhale 2 puffs into the lungs every 6 (six) hours as needed for wheezing.   cetirizine 10 MG chewable tablet Commonly known as: ZYRTEC Chew 10 mg by mouth daily.   clonazePAM 0.5 MG tablet Commonly known as: KLONOPIN Take 0.5 mg by mouth 2 (two) times daily as needed for anxiety.   enoxaparin 40 MG/0.4ML injection Commonly known as: LOVENOX Inject  0.4 mLs (40 mg total) into the skin daily.   EPINEPHrine 0.3 mg/0.3 mL Soaj injection Commonly known as: EPI-PEN Inject 3 mLs into the muscle once. As needed for Anaphylaxis   escitalopram 10 MG tablet Commonly known as: LEXAPRO Take 10 mg by mouth daily.   gabapentin 100 MG capsule Commonly known as: NEURONTIN Take 1 capsule (100 mg total) by mouth 3 (three) times daily.   HAIR SKIN & NAILS ADVANCED PO Take 3 tablets by mouth daily.   MULTI ADULT GUMMIES PO Take 1 tablet by mouth daily.   methocarbamol 500 MG tablet Commonly known as: ROBAXIN Take 1 tablet (500 mg total) by mouth every 6 (six) hours as needed for muscle spasms.   oxyCODONE-acetaminophen 7.5-325 MG tablet Commonly known as: Percocet Take 1 tablet by mouth every 4 (four) hours as needed for severe pain.   traZODone 50 MG tablet Commonly known as: DESYREL Take 25 mg by mouth at bedtime as needed for sleep.   Vitamin D3 50 MCG (2000 UT) capsule Take 1 capsule (2,000 Units total) by mouth daily.   Vyvanse 30 MG capsule Generic drug: lisdexamfetamine Take 30 mg by mouth every morning.            Durable Medical Equipment  (From admission, onward)         Start     Ordered  12/28/19 1955  For home use only DME Tub bench  Once     12/28/19 1955   12/28/19 1954  For home use only DME 3 n 1  Once     12/28/19 1955   12/28/19 1954  For home use only DME Walker rolling  Once    Question Answer Comment  Walker: With 5 Inch Wheels   Patient needs a walker to treat with the following condition Closed fracture of posterior wall of left acetabulum (HCC)      12/28/19 1955         Follow-up Information    Haddix, Gillie Manners, MD. Schedule an appointment as soon as possible for a visit in 2 week(s).   Specialty: Orthopedic Surgery Why: For repeat x-rays left hip Contact information: [address] [place] [address] [phone]           Discharge Instructions and Plan: Patient will be  discharged to home with home health physical and occupational therapy.  She will remain touchdown weightbearing to the left lower extremity for likely a total of 6 weeks.  Will be discharged on Lovenox x 30 days for DVT prophylaxis. Patient has been provided with all the necessary DME for discharge. Patient will follow up with Dr. Jena Gauss in 2 weeks for repeat x-rays and wound check.   Signed:  Shawn Route. Ladonna Snide ?[phone]? (phone) 12/30/2019, 2:25 PM  [facility] [address] [place] [address] [phone] (F)

## 2019-12-30 NOTE — Plan of Care (Signed)
  Problem: Pain Managment: Goal: General experience of comfort will improve Outcome: Progressing   

## 2019-12-30 NOTE — Progress Notes (Signed)
Physical Therapy Treatment Patient Details Name: Megan Foley MRN: [id number] DOB: December 27, 1980 Today's Date: 12/30/2019    History of Present Illness Pt is 39 yo female who tripped over playground equipment at work and fell.  She sustained L posterior wall acetabular fracture/dislocation and is s/p closed reduction of hip dislocation and placement of traction pin on 2/8, and now ORIF of acetabular fx and removal of traction pin on 2/10.  Pt with TDWB status.    PT Comments    Pt OOB in recliner upon arrival of PT, agreeable to PT session with focus on stair training this afternoon. The pt continues to present with limitations in functional mobility, endurance, and dynamic stability compared to her prior level of function and independence due to above dx. The pt was able to demo good ambulation and transfers with RW and minG-supervision while maintaining NWB LLE, and was also able to demo good tolerance for stairs she will need to navigate to get home. The pt had sig increase in pain with stair navigation, so multiple techniques were discussed with her as well as with her husband who arrived at the end of the session. The pt could still benefit from continued skilled PT to progress efficiency and tolerance for stairs, but could safely get into her home at this point if she desires to d/c home today. PT will continue to follow acutely.     Follow Up Recommendations  Home health PT;Supervision/Assistance - 24 hour     Equipment Recommendations  Rolling walker with 5" wheels;3in1 (PT)(3-in-1)    Recommendations for Other Services       Precautions / Restrictions Precautions Precautions: Fall Restrictions Weight Bearing Restrictions: Yes LLE Weight Bearing: Touchdown weight bearing    Mobility  Bed Mobility Overal bed mobility: Needs Assistance Bed Mobility: Sit to Supine       Sit to supine: Min assist;HOB elevated   General bed mobility comments: minA for BLE to return to  bed  Transfers Overall transfer level: Needs assistance Equipment used: Rolling walker (2 wheeled) Transfers: Sit to/from Stand Sit to Stand: Min guard Stand pivot transfers: Min guard       General transfer comment: min guard for rise from recliner chair and elevated toilet seat with use of arm rests  Ambulation/Gait Ambulation/Gait assistance: Min assist Gait Distance (Feet): 100 Feet Assistive device: Rolling walker (2 wheeled) Gait Pattern/deviations: Step-to pattern Gait velocity: decreased Gait velocity interpretation: <1.31 ft/sec, indicative of household ambulator General Gait Details: Pt maintaining NWB on L LE (reports fearful of TDWB); hop to pattern. Slow and guarded gait, but overall steady with RW.   Stairs Stairs: Yes Stairs assistance: Min guard Stair Management: One rail Left;Backwards;Forwards;With walker Number of Stairs: 2 General stair comments: pt completed 2 steps backwards with RW, then 3 steps forwards with rail and folded RW   Wheelchair Mobility    Modified Rankin (Stroke Patients Only)       Balance Overall balance assessment: Needs assistance Sitting-balance support: Feet supported;No upper extremity supported Sitting balance-Leahy Scale: Good Sitting balance - Comments: mod I   Standing balance support: Bilateral upper extremity supported;During functional activity Standing balance-Leahy Scale: Fair Standing balance comment: no UE support with static stand, BUE for ambulation                            Cognition Arousal/Alertness: Awake/alert Behavior During Therapy: WFL for tasks assessed/performed Overall Cognitive Status: Within Functional Limits for tasks assessed  Exercises      General Comments        Pertinent Vitals/Pain Pain Assessment: Faces Faces Pain Scale: Hurts even more Pain Location: L hip Pain Descriptors / Indicators:  Discomfort;Sharp;Burning;Grimacing;Guarding;Operative site guarding Pain Intervention(s): Limited activity within patient's tolerance;Monitored during session;Repositioned    Home Living                      Prior Function            PT Goals (current goals can now be found in the care plan section) Acute Rehab PT Goals Patient Stated Goal: return home; decrease pain PT Goal Formulation: With patient Time For Goal Achievement: 01/11/20 Potential to Achieve Goals: Good Additional Goals Additional Goal #1: L hip strength to 3/5 to assist with transfers Progress towards PT goals: Progressing toward goals    Frequency    Min 6X/week      PT Plan Current plan remains appropriate    Co-evaluation              AM-PAC PT "6 Clicks" Mobility   Outcome Measure  Help needed turning from your back to your side while in a flat bed without using bedrails?: A Little Help needed moving from lying on your back to sitting on the side of a flat bed without using bedrails?: A Little Help needed moving to and from a bed to a chair (including a wheelchair)?: A Little Help needed standing up from a chair using your arms (e.g., wheelchair or bedside chair)?: A Little Help needed to walk in hospital room?: A Little Help needed climbing 3-5 steps with a railing? : A Lot 6 Click Score: 17    End of Session Equipment Utilized During Treatment: Gait belt Activity Tolerance: Patient tolerated treatment well Patient left: with call bell/phone within reach;in bed;with family/visitor present Nurse Communication: Mobility status PT Visit Diagnosis: Unsteadiness on feet (R26.81);Other abnormalities of gait and mobility (R26.89);Muscle weakness (generalized) (M62.81)     Time: 0347-4259 PT Time Calculation (min) (ACUTE ONLY): 39 min  Charges:  $Gait Training: 38-52 mins                     Rolm Baptise, PT, DPT   Acute Rehabilitation Department Pager #: [phone]   Gaetana Michaelis 12/30/2019, 4:22 PM

## 2019-12-30 NOTE — Progress Notes (Signed)
Discharge paperwork and instructions given to pt. Pt not in distress and tolerated well. 

## 2020-01-02 NOTE — Anesthesia Postprocedure Evaluation (Signed)
Anesthesia Post Note  Patient: Megan Foley  Procedure(s) Performed: CLOSED REDUCTION HIP (Left Hip) INSERTION OF TRACTION PIN (Left Leg Upper)     Patient location during evaluation: PACU Anesthesia Type: General Level of consciousness: awake and alert Pain management: pain level controlled Vital Signs Assessment: post-procedure vital signs reviewed and stable Respiratory status: spontaneous breathing, nonlabored ventilation, respiratory function stable and patient connected to nasal cannula oxygen Cardiovascular status: blood pressure returned to baseline and stable Postop Assessment: no apparent nausea or vomiting Anesthetic complications: no    Last Vitals:  Vitals:   12/30/19 0845 12/30/19 1303  BP: 120/71 108/64  Pulse: 75 88  Resp: 18   Temp: 36.8 C 36.6 C  SpO2: 96% 100%    Last Pain:  Vitals:   12/30/19 1303  TempSrc: Oral  PainSc:                  Shelton Silvas

## 2020-04-12 ENCOUNTER — Ambulatory Visit (INDEPENDENT_AMBULATORY_CARE_PROVIDER_SITE_OTHER): Payer: BC Managed Care – PPO | Admitting: Psychology

## 2020-04-12 DIAGNOSIS — F410 Panic disorder [episodic paroxysmal anxiety] without agoraphobia: Secondary | ICD-10-CM | POA: Diagnosis not present

## 2020-04-12 DIAGNOSIS — F331 Major depressive disorder, recurrent, moderate: Secondary | ICD-10-CM

## 2020-04-19 ENCOUNTER — Ambulatory Visit (INDEPENDENT_AMBULATORY_CARE_PROVIDER_SITE_OTHER): Payer: BC Managed Care – PPO | Admitting: Psychology

## 2020-04-19 DIAGNOSIS — F411 Generalized anxiety disorder: Secondary | ICD-10-CM | POA: Diagnosis not present

## 2020-04-19 DIAGNOSIS — F331 Major depressive disorder, recurrent, moderate: Secondary | ICD-10-CM

## 2020-04-26 ENCOUNTER — Ambulatory Visit: Payer: BC Managed Care – PPO | Admitting: Psychology

## 2020-05-03 ENCOUNTER — Ambulatory Visit (INDEPENDENT_AMBULATORY_CARE_PROVIDER_SITE_OTHER): Payer: BC Managed Care – PPO | Admitting: Psychology

## 2020-05-03 DIAGNOSIS — F331 Major depressive disorder, recurrent, moderate: Secondary | ICD-10-CM | POA: Diagnosis not present

## 2020-05-03 DIAGNOSIS — F411 Generalized anxiety disorder: Secondary | ICD-10-CM | POA: Diagnosis not present

## 2020-05-10 ENCOUNTER — Ambulatory Visit (INDEPENDENT_AMBULATORY_CARE_PROVIDER_SITE_OTHER): Payer: BC Managed Care – PPO | Admitting: Psychology

## 2020-05-10 DIAGNOSIS — F411 Generalized anxiety disorder: Secondary | ICD-10-CM | POA: Diagnosis not present

## 2020-05-10 DIAGNOSIS — F331 Major depressive disorder, recurrent, moderate: Secondary | ICD-10-CM

## 2020-05-30 ENCOUNTER — Ambulatory Visit (INDEPENDENT_AMBULATORY_CARE_PROVIDER_SITE_OTHER): Payer: BC Managed Care – PPO | Admitting: Psychology

## 2020-05-30 DIAGNOSIS — F411 Generalized anxiety disorder: Secondary | ICD-10-CM

## 2020-05-30 DIAGNOSIS — F331 Major depressive disorder, recurrent, moderate: Secondary | ICD-10-CM

## 2020-06-19 ENCOUNTER — Ambulatory Visit: Payer: BC Managed Care – PPO | Admitting: Psychology

## 2020-07-03 IMAGING — CT CT HIP*L* W/O CM
2 of 4 series · 15 of 46 positions shown, 17 images · non-contrast
Comparison: Multiple priors recent radiograph same day

CLINICAL DATA: Left acetabular fracture fixation

EXAM:
CT OF THE LEFT HIP WITHOUT CONTRAST
TECHNIQUE: Multidetector CT imaging of the left hip was performed according to
the standard protocol. Multiplanar CT image reconstructions were
also generated.

[Series 3: hip 2.0 (person_name) (person_name) · axial · 0.56mm/px · z∈[-280,-112]mm · 12 of 98 slices shown, 14 images]
[im 7/98  soft-tissue]
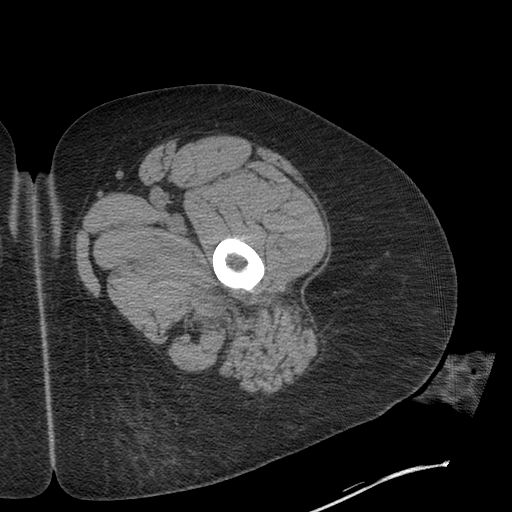
[im 7/98  bone]
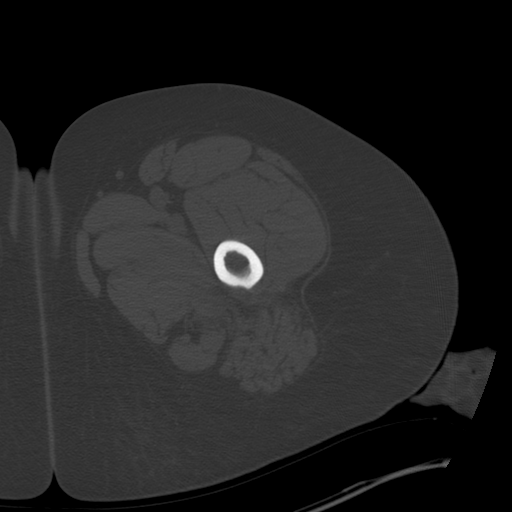
[im 13/98  soft-tissue]
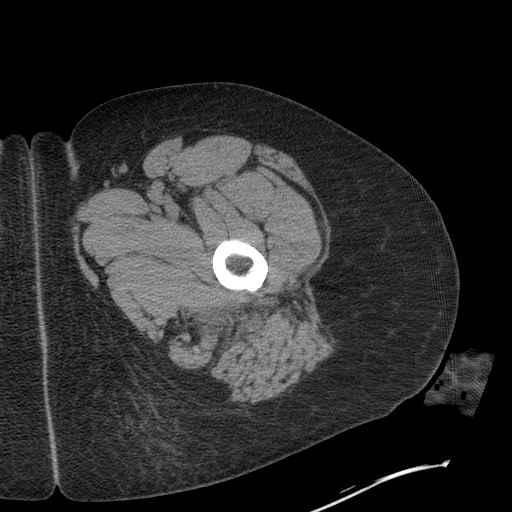
[im 20/98  soft-tissue]
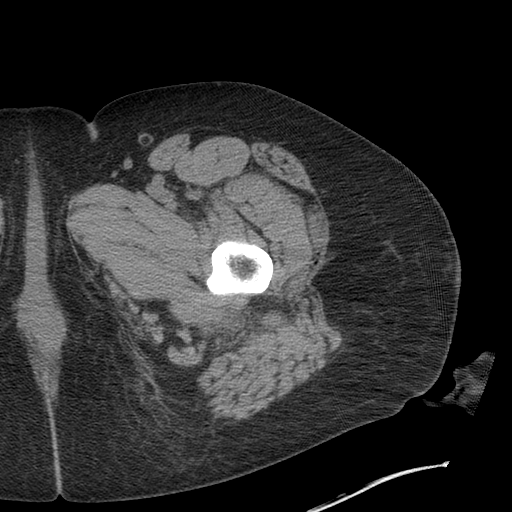
[im 33/98  soft-tissue]
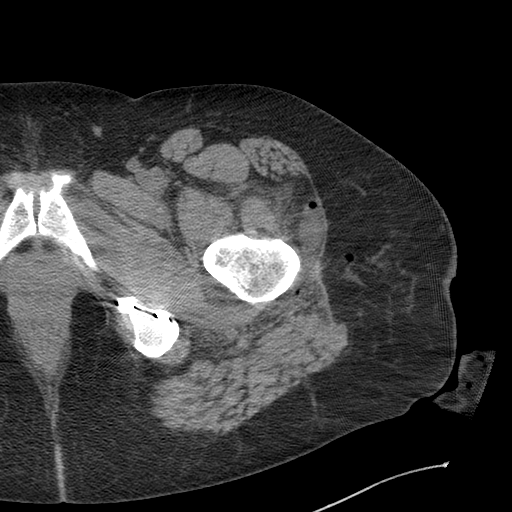
[im 39/98  soft-tissue]
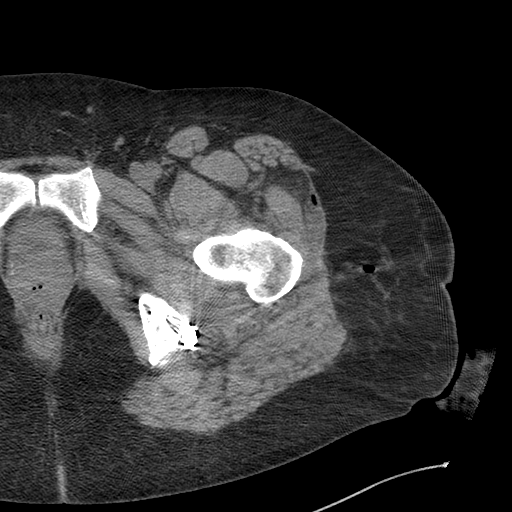
[im 46/98  soft-tissue]
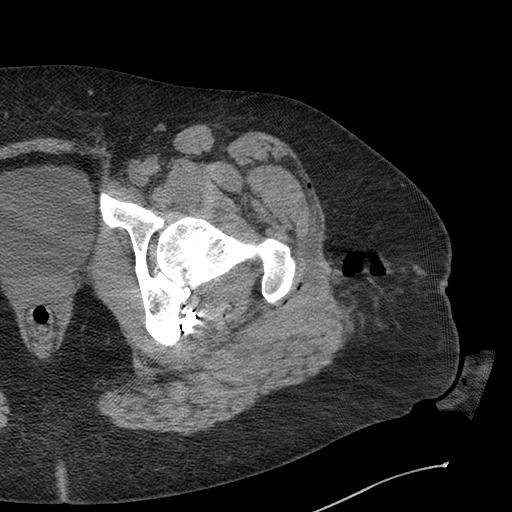
[im 52/98  soft-tissue]
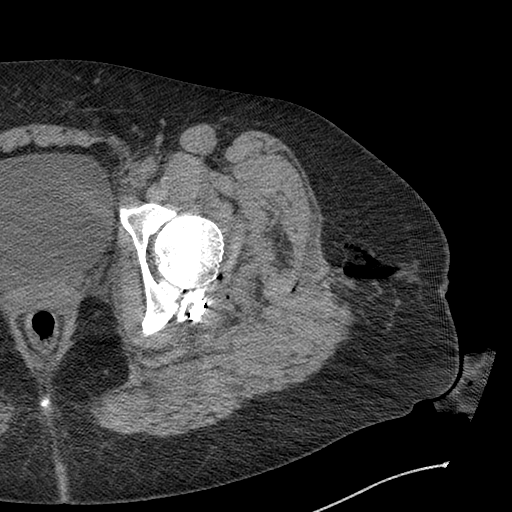
[im 59/98  soft-tissue]
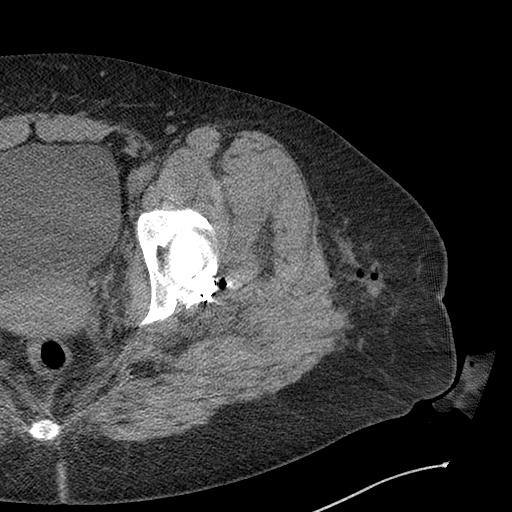
[im 65/98  soft-tissue]
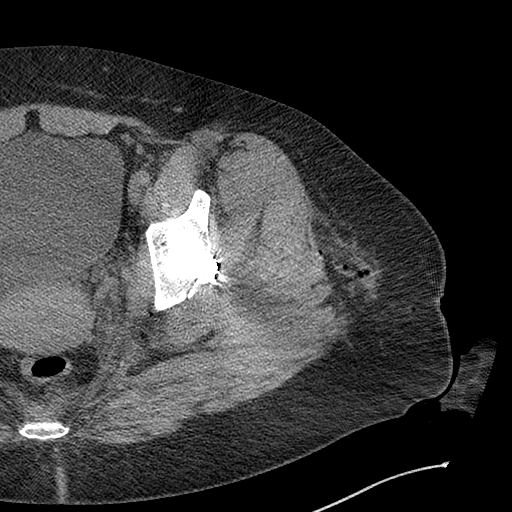
[im 65/98  bone]
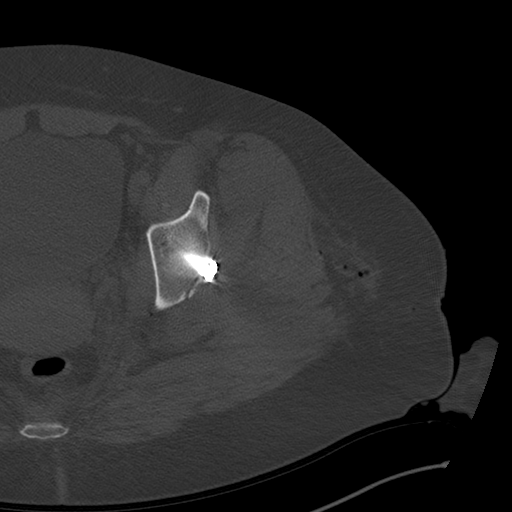
[im 78/98  soft-tissue]
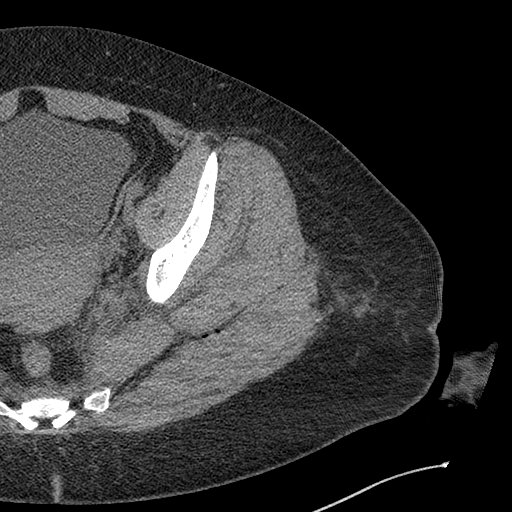
[im 85/98  soft-tissue]
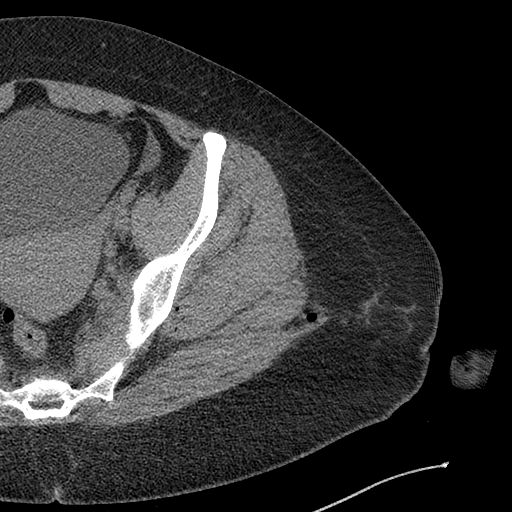
[im 91/98  soft-tissue]
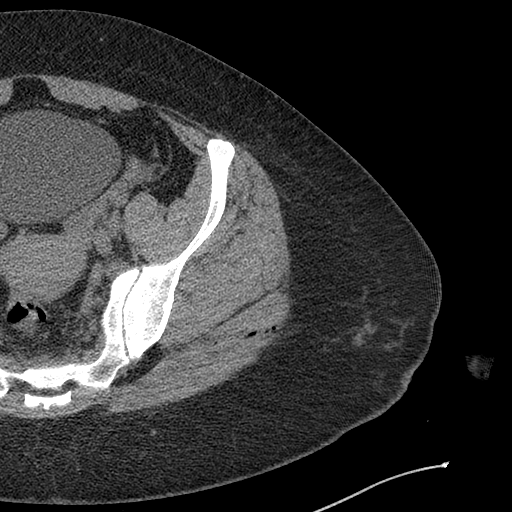

[Series 6: hip 2.0 cor. (person_name) · coronal · 0.38mm/px · 3 of 156 slices shown]
[im 52/156  soft-tissue]
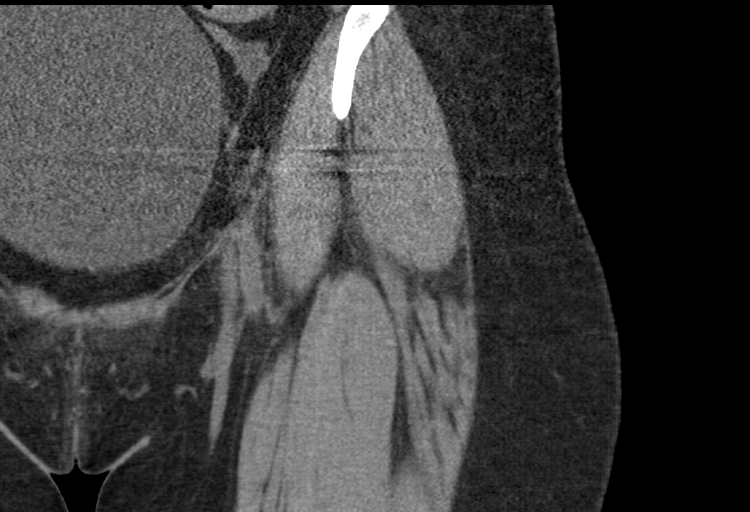
[im 69/156  soft-tissue]
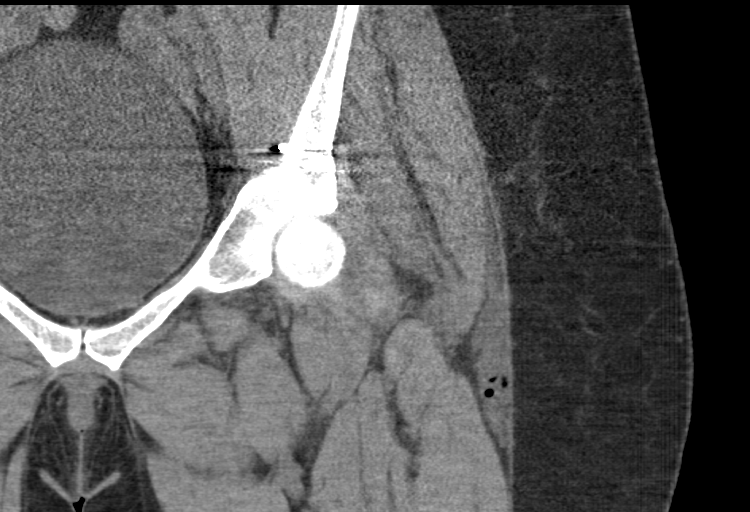
[im 87/156  soft-tissue]
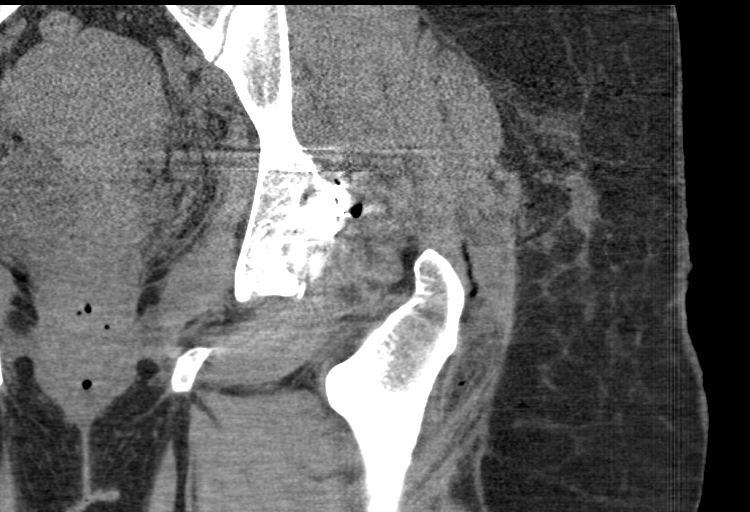

[15 of 46 positions shown; findings below may reference images not displayed]

FINDINGS: Bones/Joint/Cartilage

The patient is status post ORIF with plate and screw fixation across
the posterior acetabular fracture involving the posterior wall and
column. No periprosthetic lucency or new fracture seen. There is
improved alignment of the mildly distracted fracture fragments. The
femoral head is still well seated within the acetabulum. The
posterior ischial tuberosity screw fixation appears to traverse into
the adjacent soft tissues. The sacroiliac joint appears to be
intact. There is mild overlying subcutaneous emphysema seen within
the gluteal muscle and lateral soft tissues. Mild overlying soft
tissue edema. There is also a small amount of subcutaneous emphysema
seen within beneath the iliotibial tract.

Ligaments

Suboptimally assessed by CT.

Muscles and Tendons

As described above. Small amount of subcutaneous emphysema. There
remains mild edema within the internal obturator and gluteal
musculature. The muscles however do appear to be intact. The
visualized portions of the tendons are intact.

Soft tissues

Overlying subcutaneous emphysema and soft tissue edema along the
lateral hip. No definite soft tissue hematoma.
IMPRESSION: Status post ORIF with plate screw fixation of the posterior
acetabular column and wall fracture. No acute hardware complication.
Improved alignment of the mildly displaced fracture fragments.

Overlying postsurgical changes along the lateral hip.

Internal obturator and gluteal muscular edema.

## 2023-01-23 ENCOUNTER — Other Ambulatory Visit (HOSPITAL_COMMUNITY): Payer: Self-pay

## 2023-01-26 ENCOUNTER — Other Ambulatory Visit (HOSPITAL_COMMUNITY): Payer: Self-pay

## 2023-01-26 MED ORDER — LISDEXAMFETAMINE DIMESYLATE 40 MG PO CAPS
40.0000 mg | ORAL_CAPSULE | Freq: Every morning | ORAL | 0 refills | Status: DC
  Filled 2023-01-26 (×2): qty 30, 30d supply, fill #0

## 2023-01-27 ENCOUNTER — Other Ambulatory Visit (HOSPITAL_COMMUNITY): Payer: Self-pay

## 2023-02-23 ENCOUNTER — Other Ambulatory Visit (HOSPITAL_COMMUNITY): Payer: Self-pay

## 2023-02-23 MED ORDER — LISDEXAMFETAMINE DIMESYLATE 40 MG PO CAPS
40.0000 mg | ORAL_CAPSULE | Freq: Every morning | ORAL | 0 refills | Status: DC
  Filled 2023-02-23: qty 30, 30d supply, fill #0

## 2023-02-24 ENCOUNTER — Other Ambulatory Visit (HOSPITAL_COMMUNITY): Payer: Self-pay

## 2023-02-24 MED ORDER — LISDEXAMFETAMINE DIMESYLATE 40 MG PO CHEW
40.0000 mg | CHEWABLE_TABLET | Freq: Every day | ORAL | 0 refills | Status: DC
  Filled 2023-02-24 – 2023-03-13 (×2): qty 30, 30d supply, fill #0

## 2023-02-27 ENCOUNTER — Other Ambulatory Visit (HOSPITAL_COMMUNITY): Payer: Self-pay

## 2023-03-04 ENCOUNTER — Other Ambulatory Visit (HOSPITAL_COMMUNITY): Payer: Self-pay

## 2023-03-04 MED ORDER — HYDROCODONE-ACETAMINOPHEN 10-325 MG PO TABS
1.0000 | ORAL_TABLET | Freq: Four times a day (QID) | ORAL | 0 refills | Status: DC | PRN
  Filled 2023-03-04 – 2023-03-13 (×2): qty 16, 4d supply, fill #0

## 2023-03-04 MED ORDER — IBUPROFEN 800 MG PO TABS
800.0000 mg | ORAL_TABLET | Freq: Three times a day (TID) | ORAL | 0 refills | Status: AC | PRN
  Filled 2023-03-04 – 2023-03-13 (×2): qty 30, 10d supply, fill #0

## 2023-03-04 MED ORDER — AMOXICILLIN 500 MG PO CAPS
500.0000 mg | ORAL_CAPSULE | Freq: Three times a day (TID) | ORAL | 0 refills | Status: DC
  Filled 2023-03-04 – 2023-03-13 (×2): qty 23, 8d supply, fill #0

## 2023-03-05 ENCOUNTER — Other Ambulatory Visit (HOSPITAL_COMMUNITY): Payer: Self-pay

## 2023-03-06 ENCOUNTER — Other Ambulatory Visit (HOSPITAL_COMMUNITY): Payer: Self-pay

## 2023-03-10 ENCOUNTER — Other Ambulatory Visit: Payer: Self-pay

## 2023-03-11 ENCOUNTER — Other Ambulatory Visit (HOSPITAL_COMMUNITY): Payer: Self-pay

## 2023-03-12 ENCOUNTER — Other Ambulatory Visit (HOSPITAL_COMMUNITY): Payer: Self-pay

## 2023-03-12 ENCOUNTER — Encounter (HOSPITAL_COMMUNITY): Payer: Self-pay

## 2023-03-13 ENCOUNTER — Other Ambulatory Visit: Payer: Self-pay

## 2023-03-13 ENCOUNTER — Other Ambulatory Visit (HOSPITAL_COMMUNITY): Payer: Self-pay

## 2023-03-16 ENCOUNTER — Other Ambulatory Visit (HOSPITAL_COMMUNITY): Payer: Self-pay

## 2023-03-16 MED ORDER — TRAZODONE HCL 50 MG PO TABS
25.0000 mg | ORAL_TABLET | Freq: Every evening | ORAL | 0 refills | Status: DC | PRN
  Filled 2023-03-16: qty 30, 30d supply, fill #0

## 2023-04-07 ENCOUNTER — Other Ambulatory Visit (HOSPITAL_COMMUNITY): Payer: Self-pay

## 2023-04-07 MED ORDER — ESCITALOPRAM OXALATE 20 MG PO TABS
20.0000 mg | ORAL_TABLET | Freq: Every day | ORAL | 1 refills | Status: DC
  Filled 2023-04-07: qty 90, 90d supply, fill #0
  Filled 2023-07-03 (×3): qty 90, 90d supply, fill #1

## 2023-04-07 MED ORDER — LISDEXAMFETAMINE DIMESYLATE 40 MG PO CHEW
40.0000 mg | CHEWABLE_TABLET | Freq: Every day | ORAL | 0 refills | Status: DC | PRN
  Filled 2023-04-10: qty 30, 30d supply, fill #0

## 2023-04-10 ENCOUNTER — Other Ambulatory Visit (HOSPITAL_COMMUNITY): Payer: Self-pay

## 2023-04-10 ENCOUNTER — Other Ambulatory Visit: Payer: Self-pay

## 2023-04-28 ENCOUNTER — Other Ambulatory Visit (HOSPITAL_COMMUNITY): Payer: Self-pay

## 2023-04-28 MED ORDER — HYDROCODONE-ACETAMINOPHEN 10-325 MG PO TABS
1.0000 | ORAL_TABLET | Freq: Four times a day (QID) | ORAL | 0 refills | Status: AC | PRN
  Filled 2023-04-28: qty 16, 4d supply, fill #0

## 2023-04-28 MED ORDER — AMOXICILLIN 500 MG PO CAPS
500.0000 mg | ORAL_CAPSULE | Freq: Three times a day (TID) | ORAL | 0 refills | Status: DC
  Filled 2023-04-28: qty 23, 8d supply, fill #0

## 2023-04-29 ENCOUNTER — Other Ambulatory Visit (HOSPITAL_COMMUNITY): Payer: Self-pay

## 2023-05-09 ENCOUNTER — Other Ambulatory Visit (HOSPITAL_COMMUNITY): Payer: Self-pay

## 2023-05-12 ENCOUNTER — Other Ambulatory Visit (HOSPITAL_COMMUNITY): Payer: Self-pay

## 2023-05-12 MED ORDER — LISDEXAMFETAMINE DIMESYLATE 40 MG PO CHEW
40.0000 mg | CHEWABLE_TABLET | Freq: Every day | ORAL | 0 refills | Status: DC | PRN
  Filled 2023-05-12: qty 30, 30d supply, fill #0

## 2023-05-13 ENCOUNTER — Other Ambulatory Visit (HOSPITAL_COMMUNITY): Payer: Self-pay

## 2023-06-15 ENCOUNTER — Other Ambulatory Visit (HOSPITAL_COMMUNITY): Payer: Self-pay

## 2023-06-15 MED ORDER — LISDEXAMFETAMINE DIMESYLATE 40 MG PO CHEW
CHEWABLE_TABLET | ORAL | 0 refills | Status: AC
Start: 2023-06-15 — End: ?
  Filled 2023-06-15 – 2023-06-25 (×2): qty 30, 30d supply, fill #0

## 2023-06-23 ENCOUNTER — Other Ambulatory Visit (HOSPITAL_COMMUNITY): Payer: Self-pay

## 2023-06-23 MED ORDER — CLONAZEPAM 0.5 MG PO TABS
0.2500 mg | ORAL_TABLET | Freq: Two times a day (BID) | ORAL | 0 refills | Status: DC | PRN
Start: 2023-06-23 — End: 2023-09-02
  Filled 2023-06-23 – 2023-07-03 (×2): qty 30, 15d supply, fill #0

## 2023-06-25 ENCOUNTER — Other Ambulatory Visit (HOSPITAL_COMMUNITY): Payer: Self-pay

## 2023-06-26 ENCOUNTER — Other Ambulatory Visit (HOSPITAL_COMMUNITY): Payer: Self-pay

## 2023-07-01 ENCOUNTER — Other Ambulatory Visit (HOSPITAL_COMMUNITY): Payer: Self-pay

## 2023-07-01 MED ORDER — AMOXICILLIN 500 MG PO CAPS
500.0000 mg | ORAL_CAPSULE | Freq: Four times a day (QID) | ORAL | 0 refills | Status: AC
  Filled 2023-07-01: qty 30, 8d supply, fill #0

## 2023-07-01 MED ORDER — IBUPROFEN 800 MG PO TABS
800.0000 mg | ORAL_TABLET | Freq: Three times a day (TID) | ORAL | 0 refills | Status: AC | PRN
  Filled 2023-07-01: qty 30, 10d supply, fill #0

## 2023-07-03 ENCOUNTER — Other Ambulatory Visit: Payer: Self-pay

## 2023-07-03 ENCOUNTER — Other Ambulatory Visit (HOSPITAL_COMMUNITY): Payer: Self-pay

## 2023-07-03 ENCOUNTER — Encounter (HOSPITAL_COMMUNITY): Payer: Self-pay

## 2023-07-24 ENCOUNTER — Other Ambulatory Visit (HOSPITAL_COMMUNITY): Payer: Self-pay

## 2023-07-24 MED ORDER — LISDEXAMFETAMINE DIMESYLATE 40 MG PO CHEW
40.0000 mg | CHEWABLE_TABLET | Freq: Every day | ORAL | 0 refills | Status: DC | PRN
Start: 2023-07-24 — End: 2023-09-02
  Filled 2023-07-24: qty 30, 30d supply, fill #0

## 2023-08-29 ENCOUNTER — Other Ambulatory Visit (HOSPITAL_COMMUNITY): Payer: Self-pay

## 2023-09-02 ENCOUNTER — Other Ambulatory Visit (HOSPITAL_COMMUNITY): Payer: Self-pay

## 2023-09-02 MED ORDER — CLONAZEPAM 0.5 MG PO TABS
0.2500 mg | ORAL_TABLET | Freq: Two times a day (BID) | ORAL | 0 refills | Status: DC | PRN
Start: 2023-09-02 — End: 2024-01-11
  Filled 2023-09-02: qty 30, 15d supply, fill #0

## 2023-09-02 MED ORDER — LISDEXAMFETAMINE DIMESYLATE 40 MG PO CHEW
40.0000 mg | CHEWABLE_TABLET | Freq: Every day | ORAL | 0 refills | Status: DC | PRN
Start: 2023-09-02 — End: 2023-10-13
  Filled 2023-09-02: qty 30, 30d supply, fill #0

## 2023-10-11 ENCOUNTER — Other Ambulatory Visit (HOSPITAL_COMMUNITY): Payer: Self-pay

## 2023-10-12 MED ORDER — TRAZODONE HCL 50 MG PO TABS
25.0000 mg | ORAL_TABLET | Freq: Every evening | ORAL | 0 refills | Status: DC | PRN
  Filled 2023-10-12: qty 30, 30d supply, fill #0

## 2023-10-12 MED ORDER — ESCITALOPRAM OXALATE 20 MG PO TABS
20.0000 mg | ORAL_TABLET | Freq: Every day | ORAL | 1 refills | Status: DC
  Filled 2023-10-12: qty 90, 90d supply, fill #0
  Filled 2024-01-07 – 2024-01-13 (×2): qty 90, 90d supply, fill #1

## 2023-10-13 ENCOUNTER — Other Ambulatory Visit: Payer: Self-pay

## 2023-10-13 ENCOUNTER — Other Ambulatory Visit (HOSPITAL_COMMUNITY): Payer: Self-pay

## 2023-10-13 MED ORDER — LISDEXAMFETAMINE DIMESYLATE 40 MG PO CHEW
40.0000 mg | CHEWABLE_TABLET | Freq: Every day | ORAL | 0 refills | Status: DC
  Filled 2023-10-13: qty 30, 30d supply, fill #0

## 2023-11-13 ENCOUNTER — Other Ambulatory Visit (HOSPITAL_COMMUNITY): Payer: Self-pay

## 2023-11-13 MED ORDER — LISDEXAMFETAMINE DIMESYLATE 40 MG PO CHEW
40.0000 mg | CHEWABLE_TABLET | Freq: Every day | ORAL | 0 refills | Status: DC
  Filled 2023-11-13: qty 30, 30d supply, fill #0

## 2023-12-11 ENCOUNTER — Other Ambulatory Visit (HOSPITAL_COMMUNITY): Payer: Self-pay

## 2023-12-11 MED ORDER — AMPHETAMINE-DEXTROAMPHETAMINE 10 MG PO TABS
10.0000 mg | ORAL_TABLET | Freq: Every day | ORAL | 0 refills | Status: DC
  Filled 2023-12-11: qty 15, 15d supply, fill #0

## 2023-12-13 ENCOUNTER — Other Ambulatory Visit (HOSPITAL_COMMUNITY): Payer: Self-pay

## 2023-12-15 ENCOUNTER — Other Ambulatory Visit (HOSPITAL_COMMUNITY): Payer: Self-pay

## 2023-12-15 MED ORDER — LISDEXAMFETAMINE DIMESYLATE 40 MG PO CHEW
40.0000 mg | CHEWABLE_TABLET | Freq: Every day | ORAL | 0 refills | Status: DC
  Filled 2023-12-15: qty 30, 30d supply, fill #0

## 2024-01-07 ENCOUNTER — Other Ambulatory Visit: Payer: Self-pay

## 2024-01-07 ENCOUNTER — Other Ambulatory Visit (HOSPITAL_COMMUNITY): Payer: Self-pay

## 2024-01-08 ENCOUNTER — Other Ambulatory Visit: Payer: Self-pay

## 2024-01-11 ENCOUNTER — Other Ambulatory Visit (HOSPITAL_COMMUNITY): Payer: Self-pay

## 2024-01-11 ENCOUNTER — Other Ambulatory Visit: Payer: Self-pay

## 2024-01-11 MED ORDER — TRAZODONE HCL 50 MG PO TABS
25.0000 mg | ORAL_TABLET | Freq: Every evening | ORAL | 0 refills | Status: DC | PRN
  Filled 2024-01-11 – 2024-01-15 (×2): qty 30, 30d supply, fill #0

## 2024-01-12 ENCOUNTER — Other Ambulatory Visit: Payer: Self-pay

## 2024-01-13 ENCOUNTER — Other Ambulatory Visit: Payer: Self-pay

## 2024-01-13 ENCOUNTER — Other Ambulatory Visit (HOSPITAL_COMMUNITY): Payer: Self-pay

## 2024-01-13 MED ORDER — LISDEXAMFETAMINE DIMESYLATE 40 MG PO CHEW
40.0000 mg | CHEWABLE_TABLET | Freq: Every day | ORAL | 0 refills | Status: DC | PRN
  Filled 2024-01-13: qty 30, 30d supply, fill #0

## 2024-01-13 MED ORDER — METHOCARBAMOL 500 MG PO TABS
500.0000 mg | ORAL_TABLET | Freq: Three times a day (TID) | ORAL | 0 refills | Status: DC | PRN
  Filled 2024-01-13: qty 90, 30d supply, fill #0

## 2024-01-15 ENCOUNTER — Other Ambulatory Visit: Payer: Self-pay

## 2024-01-15 ENCOUNTER — Other Ambulatory Visit (HOSPITAL_COMMUNITY): Payer: Self-pay

## 2024-02-09 ENCOUNTER — Other Ambulatory Visit (HOSPITAL_COMMUNITY): Payer: Self-pay

## 2024-02-09 MED ORDER — AMPHETAMINE-DEXTROAMPHETAMINE 10 MG PO TABS
10.0000 mg | ORAL_TABLET | Freq: Every day | ORAL | 0 refills | Status: DC | PRN
  Filled 2024-02-09: qty 10, 10d supply, fill #0

## 2024-02-09 MED ORDER — METHYLPHENIDATE HCL ER (OSM) 36 MG PO TBCR
36.0000 mg | EXTENDED_RELEASE_TABLET | Freq: Every morning | ORAL | 0 refills | Status: DC
  Filled 2024-02-09 – 2024-02-10 (×2): qty 30, 30d supply, fill #0

## 2024-02-10 ENCOUNTER — Other Ambulatory Visit (HOSPITAL_COMMUNITY): Payer: Self-pay

## 2024-03-21 ENCOUNTER — Other Ambulatory Visit (HOSPITAL_COMMUNITY): Payer: Self-pay

## 2024-03-21 MED ORDER — TRAZODONE HCL 50 MG PO TABS
25.0000 mg | ORAL_TABLET | Freq: Every evening | ORAL | 0 refills | Status: DC | PRN
Start: 2024-03-21 — End: 2024-05-23
  Filled 2024-03-21: qty 30, 30d supply, fill #0

## 2024-03-21 MED ORDER — METHYLPHENIDATE HCL ER (OSM) 36 MG PO TBCR
36.0000 mg | EXTENDED_RELEASE_TABLET | ORAL | 0 refills | Status: DC
Start: 2024-03-21 — End: 2024-04-19
  Filled 2024-03-21: qty 30, 30d supply, fill #0

## 2024-03-23 ENCOUNTER — Other Ambulatory Visit (HOSPITAL_COMMUNITY): Payer: Self-pay

## 2024-04-19 ENCOUNTER — Other Ambulatory Visit: Payer: Self-pay

## 2024-04-19 ENCOUNTER — Other Ambulatory Visit (HOSPITAL_COMMUNITY): Payer: Self-pay

## 2024-04-19 MED ORDER — METHYLPHENIDATE HCL ER (OSM) 36 MG PO TBCR
36.0000 mg | EXTENDED_RELEASE_TABLET | Freq: Every morning | ORAL | 0 refills | Status: DC
  Filled 2024-04-20: qty 30, 30d supply, fill #0

## 2024-04-19 MED ORDER — ESCITALOPRAM OXALATE 20 MG PO TABS
20.0000 mg | ORAL_TABLET | Freq: Every day | ORAL | 1 refills | Status: AC
  Filled 2024-04-19: qty 90, 90d supply, fill #0
  Filled 2024-07-26: qty 90, 90d supply, fill #1

## 2024-04-20 ENCOUNTER — Other Ambulatory Visit (HOSPITAL_COMMUNITY): Payer: Self-pay

## 2024-05-23 ENCOUNTER — Other Ambulatory Visit: Payer: Self-pay

## 2024-05-23 ENCOUNTER — Other Ambulatory Visit (HOSPITAL_COMMUNITY): Payer: Self-pay

## 2024-05-23 MED ORDER — METHYLPHENIDATE HCL ER (OSM) 36 MG PO TBCR
36.0000 mg | EXTENDED_RELEASE_TABLET | Freq: Every morning | ORAL | 0 refills | Status: AC
  Filled 2024-05-23: qty 30, 30d supply, fill #0

## 2024-05-23 MED ORDER — TRAZODONE HCL 50 MG PO TABS
25.0000 mg | ORAL_TABLET | Freq: Every evening | ORAL | 0 refills | Status: AC | PRN
  Filled 2024-05-23: qty 30, 30d supply, fill #0

## 2024-05-25 ENCOUNTER — Other Ambulatory Visit: Payer: Self-pay

## 2024-05-25 ENCOUNTER — Other Ambulatory Visit (HOSPITAL_COMMUNITY): Payer: Self-pay

## 2024-05-25 MED ORDER — DESONIDE 0.05 % EX OINT
1.0000 | TOPICAL_OINTMENT | Freq: Two times a day (BID) | CUTANEOUS | 1 refills | Status: AC
  Filled 2024-05-25: qty 60, 30d supply, fill #0

## 2024-05-25 MED ORDER — AMPHETAMINE-DEXTROAMPHETAMINE 10 MG PO TABS
10.0000 mg | ORAL_TABLET | Freq: Every day | ORAL | 0 refills | Status: DC
  Filled 2024-05-25: qty 10, 10d supply, fill #0

## 2024-05-25 MED ORDER — METHYLPHENIDATE HCL ER (OSM) 54 MG PO TBCR
54.0000 mg | EXTENDED_RELEASE_TABLET | Freq: Every morning | ORAL | 0 refills | Status: DC
  Filled 2024-06-21: qty 30, 30d supply, fill #0

## 2024-06-20 ENCOUNTER — Encounter (HOSPITAL_COMMUNITY): Payer: Self-pay

## 2024-06-20 ENCOUNTER — Other Ambulatory Visit (HOSPITAL_COMMUNITY): Payer: Self-pay

## 2024-06-20 MED ORDER — AMPHETAMINE-DEXTROAMPHETAMINE 10 MG PO TABS
10.0000 mg | ORAL_TABLET | Freq: Every day | ORAL | 0 refills | Status: DC
  Filled 2024-06-20: qty 10, 10d supply, fill #0

## 2024-06-21 ENCOUNTER — Other Ambulatory Visit (HOSPITAL_COMMUNITY): Payer: Self-pay

## 2024-08-03 ENCOUNTER — Other Ambulatory Visit (HOSPITAL_COMMUNITY): Payer: Self-pay

## 2024-08-29 ENCOUNTER — Other Ambulatory Visit (HOSPITAL_COMMUNITY): Payer: Self-pay

## 2024-10-17 ENCOUNTER — Other Ambulatory Visit (HOSPITAL_COMMUNITY): Payer: Self-pay

## 2024-10-31 ENCOUNTER — Other Ambulatory Visit (HOSPITAL_COMMUNITY): Payer: Self-pay

## 2024-11-01 ENCOUNTER — Other Ambulatory Visit (HOSPITAL_COMMUNITY): Payer: Self-pay

## 2024-11-02 ENCOUNTER — Other Ambulatory Visit (HOSPITAL_COMMUNITY): Payer: Self-pay

## 2024-11-02 MED ORDER — METHYLPHENIDATE HCL ER (OSM) 54 MG PO TBCR
54.0000 mg | EXTENDED_RELEASE_TABLET | Freq: Every morning | ORAL | 0 refills | Status: DC
  Filled 2024-11-02: qty 30, 30d supply, fill #0

## 2024-11-05 ENCOUNTER — Other Ambulatory Visit (HOSPITAL_COMMUNITY): Payer: Self-pay

## 2024-11-07 ENCOUNTER — Other Ambulatory Visit (HOSPITAL_COMMUNITY): Payer: Self-pay

## 2024-11-07 ENCOUNTER — Other Ambulatory Visit: Payer: Self-pay

## 2024-11-07 MED ORDER — METHOCARBAMOL 500 MG PO TABS
500.0000 mg | ORAL_TABLET | Freq: Three times a day (TID) | ORAL | 0 refills | Status: AC | PRN
  Filled 2024-11-07: qty 90, 30d supply, fill #0

## 2024-11-08 ENCOUNTER — Other Ambulatory Visit (HOSPITAL_COMMUNITY): Payer: Self-pay

## 2024-12-07 ENCOUNTER — Other Ambulatory Visit (HOSPITAL_COMMUNITY): Payer: Self-pay

## 2024-12-07 ENCOUNTER — Encounter (HOSPITAL_COMMUNITY): Payer: Self-pay

## 2024-12-07 MED ORDER — METHYLPHENIDATE HCL ER (OSM) 54 MG PO TBCR
54.0000 mg | EXTENDED_RELEASE_TABLET | Freq: Every morning | ORAL | 0 refills | Status: AC
  Filled 2024-12-07 (×2): qty 30, 30d supply, fill #0

## 2024-12-09 ENCOUNTER — Other Ambulatory Visit (HOSPITAL_COMMUNITY): Payer: Self-pay
